# Patient Record
Sex: Female | Born: 2000 | Hispanic: Yes | Marital: Single | Smoking: Never smoker
Health system: Southern US, Community
[De-identification: ages and names within clinical notes are randomized; demographics above are authoritative.]

---
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
## 2021-01-12 ENCOUNTER — Encounter: Payer: Self-pay | Admitting: Emergency Medicine

## 2021-01-12 ENCOUNTER — Emergency Department
Admission: EM | Admit: 2021-01-12 | Discharge: 2021-01-12 | Disposition: A | Discharge status: Home / Self Care | Payer: Medicaid Other | Attending: Emergency Medicine | Admitting: Emergency Medicine

## 2021-01-12 ENCOUNTER — Other Ambulatory Visit: Payer: Self-pay

## 2021-01-12 DIAGNOSIS — H66012 Acute suppurative otitis media with spontaneous rupture of ear drum, left ear: Secondary | ICD-10-CM | POA: Insufficient documentation

## 2021-01-12 DIAGNOSIS — H9202 Otalgia, left ear: Secondary | ICD-10-CM | POA: Diagnosis present

## 2021-01-12 MED ORDER — AMOXICILLIN-POT CLAVULANATE 875-125 MG PO TABS: 1.0000 | ORAL_TABLET | Freq: Two times a day (BID) | ORAL | 0 refills | Status: AC

## 2021-01-12 MED ORDER — AMOXICILLIN-POT CLAVULANATE 875-125 MG PO TABS
1.0000 | ORAL_TABLET | Freq: Once | ORAL | Status: AC
  Administered 2021-01-12: 1 via ORAL
  Filled 2021-01-12: qty 1

## 2021-01-12 NOTE — Discharge Instructions (Addendum)
You were seen today for left ear pain.  Your left eardrum has ruptured.  I have put you on antibiotics twice daily for the next 10 days.  Please use cotton balls in your left ear to avoid water getting in the ear canal.  Please follow-up with ENT as an outpatient.

## 2021-01-12 NOTE — ED Notes (Signed)
D/C instructions given.  RX and follow up discussed.  All questions addressed.  Understanding verbalized.  Pt left ER ambulatory with mother.

## 2021-01-12 NOTE — ED Provider Notes (Signed)
[facility] Emergency Department Provider Note ____________________________________________  Time seen: 1745  I have reviewed the triage vital signs and the nursing notes.  HISTORY  Chief Complaint  Otalgia   HPI Alyssa Lucas is a 20 y.o. female presents to the ER with complaint of left ear pain and drainage.  She reports this started 3 days ago.  She was unable to describe what the ear pain felt like.  She reports the pain suddenly went away but then she noticed bloody drainage from her ear.  She denies headaches, visual changes, runny nose, nasal congestion, hearing loss, sore throat, cough or shortness of breath.  She denies fever, chills or body aches.  She denies any penetrating trauma to the left ear.  She has not taken anything OTC for this.  She reports history of recurrent ear infections from childhood.  History reviewed. No pertinent past medical history.  There are no problems to display for this patient.   History reviewed. No pertinent surgical history.  Prior to Admission medications   Medication Sig Start Date End Date Taking? Authorizing Provider  amoxicillin-clavulanate (AUGMENTIN) 875-125 MG tablet Take 1 tablet by mouth every 12 (twelve) hours for 10 days. 01/12/21 01/22/21 Yes BaitySalvadore Oxford, NP    Allergies Patient has no known allergies.  History reviewed. No pertinent family history.  Social History Social History   Tobacco Use  . Smoking status: Never Smoker  . Smokeless tobacco: Never Used  Vaping Use  . Vaping Use: Never used  Substance Use Topics  . Alcohol use: No  . Drug use: No    Review of Systems  Constitutional: Negative for fever. ENT: Positive for left ear pain and drainage.  Negative for runny nose, nasal congestion, loss of hearing or sore throat. Cardiovascular: Negative for chest pain or chest tightness. Respiratory: Negative for cough or shortness of breath. Skin: Negative for rash. Neurological:  Negative for headaches, dizziness, focal weakness, tingling or numbness. ____________________________________________  PHYSICAL EXAM:  VITAL SIGNS: ED Triage Vitals  Enc Vitals Group     BP 01/12/21 1726 117/71     Pulse Rate 01/12/21 1726 75     Resp 01/12/21 1726 16     Temp 01/12/21 1726 98.4 F (36.9 C)     Temp Source 01/12/21 1726 Oral     SpO2 01/12/21 1726 100 %     Weight --      Height --      Head Circumference --      Peak Flow --      Pain Score 01/12/21 1725 0     Pain Loc --      Pain Edu? --      Excl. in GC? --     Constitutional: Alert and oriented. Well appearing and in no distress. Head: Normocephalic and atraumatic. Eyes: Conjunctivae are normal. PERRL. Normal extraocular movements Ears: Ruptured TM on the left with dried blood noted in the external canal. Hematological/Lymphatic/Immunological: No cervical lymphadenopathy. Cardiovascular: Normal rate, regular rhythm.  Respiratory: Normal respiratory effort. No wheezes/rales/rhonchi. Neurologic:  Normal gait without ataxia. Normal speech and language. No gross focal neurologic deficits are appreciated. Skin:  Skin is warm, dry and intact. No rash noted.  INITIAL IMPRESSION / ASSESSMENT AND PLAN / ED COURSE  Ruptured TM, Right:  Augmentin 875-125 mg p.o. x1 in ER Rx for Augmentin 875-125 mg p.o. BID x 10 days Encourage use of cotton balls to avoid water getting in the ear We will have  her follow-up with ENT as an outpatient Return precautions discussed _______________________________________________  FINAL CLINICAL IMPRESSION(S) / ED DIAGNOSES  Final diagnoses:  Non-recurrent acute suppurative otitis media of left ear with spontaneous rupture of tympanic membrane      Lorre Munroe, NP 01/12/21 1850    Shaune Pollack, MD 01/13/21 [phone]

## 2021-01-12 NOTE — ED Triage Notes (Signed)
Pt to ED via POV with c/o L ear pain. Pt A&O x4, ambulatory to triage desk at this time.

## 2022-02-10 ENCOUNTER — Other Ambulatory Visit: Payer: Self-pay | Admitting: Family Medicine

## 2022-02-10 DIAGNOSIS — Z3401 Encounter for supervision of normal first pregnancy, first trimester: Secondary | ICD-10-CM

## 2022-02-17 ENCOUNTER — Ambulatory Visit
Admission: RE | Admit: 2022-02-17 | Discharge: 2022-02-17 | Disposition: A | Discharge status: Home / Self Care | Payer: Medicaid Other | Source: Ambulatory Visit | Attending: Family Medicine | Admitting: Family Medicine

## 2022-02-17 ENCOUNTER — Other Ambulatory Visit: Payer: Self-pay | Admitting: Family Medicine

## 2022-02-17 DIAGNOSIS — Z3687 Encounter for antenatal screening for uncertain dates: Secondary | ICD-10-CM | POA: Insufficient documentation

## 2022-02-17 DIAGNOSIS — Z3A01 Less than 8 weeks gestation of pregnancy: Secondary | ICD-10-CM | POA: Diagnosis not present

## 2022-02-17 DIAGNOSIS — Z3401 Encounter for supervision of normal first pregnancy, first trimester: Secondary | ICD-10-CM

## 2022-02-22 ENCOUNTER — Emergency Department: Payer: Medicaid Other

## 2022-02-22 ENCOUNTER — Other Ambulatory Visit: Payer: Self-pay

## 2022-02-22 ENCOUNTER — Emergency Department
Admission: EM | Admit: 2022-02-22 | Discharge: 2022-02-22 | Disposition: A | Discharge status: Home / Self Care | Payer: Medicaid Other | Attending: Emergency Medicine | Admitting: Emergency Medicine

## 2022-02-22 DIAGNOSIS — O418X10 Other specified disorders of amniotic fluid and membranes, first trimester, not applicable or unspecified: Secondary | ICD-10-CM

## 2022-02-22 DIAGNOSIS — Z6710 Type A blood, Rh positive: Secondary | ICD-10-CM | POA: Diagnosis not present

## 2022-02-22 DIAGNOSIS — Z3A01 Less than 8 weeks gestation of pregnancy: Secondary | ICD-10-CM | POA: Diagnosis not present

## 2022-02-22 DIAGNOSIS — O4690 Antepartum hemorrhage, unspecified, unspecified trimester: Secondary | ICD-10-CM

## 2022-02-22 DIAGNOSIS — O209 Hemorrhage in early pregnancy, unspecified: Secondary | ICD-10-CM | POA: Diagnosis present

## 2022-02-22 LAB — URINALYSIS, ROUTINE W REFLEX MICROSCOPIC
Bilirubin Urine: NEGATIVE
Glucose, UA: NEGATIVE mg/dL
Ketones, ur: NEGATIVE mg/dL
Leukocytes,Ua: NEGATIVE
Nitrite: NEGATIVE
Protein, ur: NEGATIVE mg/dL
Specific Gravity, Urine: 1.023 (ref 1.005–1.030)
pH: 5 (ref 5.0–8.0)

## 2022-02-22 LAB — CBC WITH DIFFERENTIAL/PLATELET
Abs Immature Granulocytes: 0.02 10*3/uL (ref 0.00–0.07)
Basophils Absolute: 0 10*3/uL (ref 0.0–0.1)
Basophils Relative: 1 %
Eosinophils Absolute: 0.1 10*3/uL (ref 0.0–0.5)
Eosinophils Relative: 1 %
HCT: 39.6 % (ref 36.0–46.0)
Hemoglobin: 12.7 g/dL (ref 12.0–15.0)
Immature Granulocytes: 0 %
Lymphocytes Relative: 27 %
Lymphs Abs: 2 10*3/uL (ref 0.7–4.0)
MCH: 30 pg (ref 26.0–34.0)
MCHC: 32.1 g/dL (ref 30.0–36.0)
MCV: 93.4 fL (ref 80.0–100.0)
Monocytes Absolute: 0.4 10*3/uL (ref 0.1–1.0)
Monocytes Relative: 6 %
Neutro Abs: 5 10*3/uL (ref 1.7–7.7)
Neutrophils Relative %: 65 %
Platelets: 310 10*3/uL (ref 150–400)
RBC: 4.24 MIL/uL (ref 3.87–5.11)
RDW: 13.8 % (ref 11.5–15.5)
WBC: 7.5 10*3/uL (ref 4.0–10.5)
nRBC: 0 % (ref 0.0–0.2)

## 2022-02-22 LAB — ABO/RH: ABO/RH(D): A POS

## 2022-02-22 LAB — HCG, QUANTITATIVE, PREGNANCY: hCG, Beta Chain, Quant, S: 56315 m[IU]/mL — ABNORMAL HIGH (ref <5)

## 2022-02-22 NOTE — ED Provider Notes (Signed)
? ?[facility] ?Provider Note ? ? ? Event Date/Time  ? First MD Initiated Contact with Patient 02/22/22 2008   ?  (approximate) ? ? ?History  ? ?Vaginal Bleeding ? ? ?HPI ? ?Alyssa Lucas is a 21 y.o. female with no significant past medical history who is approximately [redacted] weeks pregnant who presents with complaints of vaginal bleeding, she reports this has been going on for less than a day and she describes spotting primarily.  No abdominal pain.  This is her first pregnancy. ?  ? ? ?Physical Exam  ? ?Triage Vital Signs: ?ED Triage Vitals [02/22/22 1939]  ?Enc Vitals Group  ?   BP 132/79  ?   Pulse Rate 100  ?   Resp 16  ?   Temp 99.5 ?F (37.5 ?C)  ?   Temp Source Oral  ?   SpO2 98 %  ?   Weight 40.8 kg (90 lb)  ?   Height 1.499 m (4\' 11" )  ?   Head Circumference   ?   Peak Flow   ?   Pain Score 0  ?   Pain Loc   ?   Pain Edu?   ?   Excl. in [place]?   ? ? ?Most recent vital signs: ?Vitals:  ? 02/22/22 2130 02/22/22 2200  ?BP: 111/76 112/69  ?Pulse: 91 87  ?Resp:  17  ?Temp:    ?SpO2: 100% 100%  ? ? ? ?General: Awake, no distress.  ?CV:  Good peripheral perfusion.  ?Resp:  Normal effort.  ?Abd:  No distention.  Soft, nontender ?Other:   ? ? ?ED Results / Procedures / Treatments  ? ?Labs ?(all labs ordered are listed, but only abnormal results are displayed) ?Labs Reviewed  ?URINALYSIS, ROUTINE W REFLEX MICROSCOPIC - Abnormal; Notable for the following components:  ?    Result Value  ? Color, Urine YELLOW (*)   ? APPearance HAZY (*)   ? Hgb urine dipstick LARGE (*)   ? Bacteria, UA RARE (*)   ? All other components within normal limits  ?HCG, QUANTITATIVE, PREGNANCY - Abnormal; Notable for the following components:  ? hCG, Beta Neomia Dear 56,315 (*)   ? All other components within normal limits  ?CBC WITH DIFFERENTIAL/PLATELET  ?ABO/RH  ? ? ? ?EKG ? ? ? ? ?RADIOLOGY ?Ultrasound demonstrates gestational sac which appears smaller than prior ultrasound, I have recommended follow-up ultrasound in 7  to 10 days. + Subchorionic hemorrhage ? ? ? ?PROCEDURES: ? ?Critical Care performed:  ? ?Procedures ? ? ?MEDICATIONS ORDERED IN ED: ?Medications - No data to display ? ? ?IMPRESSION / MDM / ASSESSMENT AND PLAN / ED COURSE  ?I reviewed the triage vital signs and the nursing notes. ? ?Patient presents with vaginal bleeding at approximately [redacted] weeks gestation.  This is her first pregnancy. ? ?Differential includes subchorionic hemorrhage, miscarriage, ectopic pregnancy. ? ?Lab work reviewed, patient is Rh+, beta-hCG is 56,000, CBC is unremarkable ? ?Ultrasound performed which demonstrates slightly smaller gestational sac within ultrasound performed 5 days ago, unclear whether this is consistent with failed pregnancy.  Positive subchorionic hemorrhage ? ?Recommendation for repeat ultrasound in 7 to 10 days ? ? ? ? ? ?  ? ? ?FINAL CLINICAL IMPRESSION(S) / ED DIAGNOSES  ? ?Final diagnoses:  ?Subchorionic hematoma in first trimester, single or unspecified fetus  ?Vaginal bleeding in pregnancy  ? ? ? ?Rx / DC Orders  ? ?ED Discharge Orders   ? ?  None  ? ?  ? ? ? ?Note:  This document was prepared using Dragon voice recognition software and may include unintentional dictation errors. ?  ?Lavonia Drafts, MD ?02/22/22 2313 ? ?

## 2022-02-22 NOTE — Discharge Instructions (Addendum)
We recommend you have a repeat ultrasound in 7 to 10 days as we discussed ?

## 2022-02-22 NOTE — ED Triage Notes (Signed)
Pt states is [redacted] weeks pregnant and having vaginal bleeding that 30 min ago. Pt states bleeding is light and she has not used any pads. Pt denies dysuria.  ?

## 2022-02-23 ENCOUNTER — Other Ambulatory Visit: Payer: Self-pay | Admitting: Family Medicine

## 2022-02-25 ENCOUNTER — Other Ambulatory Visit: Payer: Self-pay | Admitting: Family Medicine

## 2022-02-25 DIAGNOSIS — Z3401 Encounter for supervision of normal first pregnancy, first trimester: Secondary | ICD-10-CM

## 2022-03-03 ENCOUNTER — Ambulatory Visit
Admission: RE | Admit: 2022-03-03 | Discharge: 2022-03-03 | Disposition: A | Discharge status: Home / Self Care | Payer: Medicaid Other | Source: Ambulatory Visit | Attending: Family Medicine | Admitting: Family Medicine

## 2022-03-03 ENCOUNTER — Other Ambulatory Visit: Payer: Self-pay | Admitting: Family Medicine

## 2022-03-03 DIAGNOSIS — Z3401 Encounter for supervision of normal first pregnancy, first trimester: Secondary | ICD-10-CM

## 2022-03-03 DIAGNOSIS — Z3A08 8 weeks gestation of pregnancy: Secondary | ICD-10-CM | POA: Insufficient documentation

## 2022-03-03 DIAGNOSIS — Z3689 Encounter for other specified antenatal screening: Secondary | ICD-10-CM | POA: Insufficient documentation

## 2022-03-05 ENCOUNTER — Encounter: Payer: Self-pay | Admitting: Emergency Medicine

## 2022-03-05 ENCOUNTER — Emergency Department
Admission: EM | Admit: 2022-03-05 | Discharge: 2022-03-06 | Disposition: A | Discharge status: Home / Self Care | Payer: Medicaid Other | Attending: Emergency Medicine | Admitting: Emergency Medicine

## 2022-03-05 ENCOUNTER — Other Ambulatory Visit: Payer: Self-pay

## 2022-03-05 DIAGNOSIS — O039 Complete or unspecified spontaneous abortion without complication: Secondary | ICD-10-CM | POA: Diagnosis not present

## 2022-03-05 DIAGNOSIS — Z3A00 Weeks of gestation of pregnancy not specified: Secondary | ICD-10-CM | POA: Insufficient documentation

## 2022-03-05 DIAGNOSIS — O209 Hemorrhage in early pregnancy, unspecified: Secondary | ICD-10-CM | POA: Diagnosis present

## 2022-03-05 LAB — CBC WITH DIFFERENTIAL/PLATELET
Abs Immature Granulocytes: 0.06 10*3/uL (ref 0.00–0.07)
Basophils Absolute: 0 10*3/uL (ref 0.0–0.1)
Basophils Relative: 0 %
Eosinophils Absolute: 0.1 10*3/uL (ref 0.0–0.5)
Eosinophils Relative: 1 %
HCT: 39 % (ref 36.0–46.0)
Hemoglobin: 13.1 g/dL (ref 12.0–15.0)
Immature Granulocytes: 1 %
Lymphocytes Relative: 13 %
Lymphs Abs: 1.4 10*3/uL (ref 0.7–4.0)
MCH: 30.3 pg (ref 26.0–34.0)
MCHC: 33.6 g/dL (ref 30.0–36.0)
MCV: 90.1 fL (ref 80.0–100.0)
Monocytes Absolute: 0.6 10*3/uL (ref 0.1–1.0)
Monocytes Relative: 5 %
Neutro Abs: 9.3 10*3/uL — ABNORMAL HIGH (ref 1.7–7.7)
Neutrophils Relative %: 80 %
Platelets: 325 10*3/uL (ref 150–400)
RBC: 4.33 MIL/uL (ref 3.87–5.11)
RDW: 13.4 % (ref 11.5–15.5)
WBC: 11.5 10*3/uL — ABNORMAL HIGH (ref 4.0–10.5)
nRBC: 0 % (ref 0.0–0.2)

## 2022-03-05 LAB — SAMPLE TO BLOOD BANK

## 2022-03-05 NOTE — ED Triage Notes (Signed)
Pt to ED from home c/o vaginal bleeding today.  States was approx 3 months pregnant.  Passed products of conception at home and thinks she did again in the lobby.  Bright red bleeding, denies clots.  First pregnancy.  Denies pain or SOB, skin WNL, A&Ox4, chest rise even and unlabored.

## 2022-03-06 LAB — HCG, QUANTITATIVE, PREGNANCY: hCG, Beta Chain, Quant, S: 12621 m[IU]/mL — ABNORMAL HIGH (ref <5)

## 2022-03-06 LAB — BASIC METABOLIC PANEL
Anion gap: 9 (ref 5–15)
BUN: 12 mg/dL (ref 6–20)
CO2: 23 mmol/L (ref 22–32)
Calcium: 8.8 mg/dL — ABNORMAL LOW (ref 8.9–10.3)
Chloride: 105 mmol/L (ref 98–111)
Creatinine, Ser: 0.51 mg/dL (ref 0.44–1.00)
GFR, Estimated: >60 mL/min (ref >60)
Glucose, Bld: 106 mg/dL — ABNORMAL HIGH (ref 70–99)
Potassium: 3.4 mmol/L — ABNORMAL LOW (ref 3.5–5.1)
Sodium: 137 mmol/L (ref 135–145)

## 2022-03-06 NOTE — ED Provider Notes (Signed)
[facility] Provider Note    Event Date/Time   First MD Initiated Contact with Patient 03/05/22 2314     (approximate)   History   Vaginal Bleeding   HPI  Alyssa GEST is a 21 y.o. female who presents for evaluation of vaginal bleeding.  Patient was seen here 11 days ago after positive pregnancy test.  She had an ultrasound that showed a questionable failed pregnancy.  She then had a repeat ultrasound done 2 days ago which confirmed fetal pregnancy.  Patient reports that she has been bleeding for the last week.  Today she passed what she thinks it was a fetal sac at home.  She is here with her mom to "make sure she has passed the pregnancy."  She denies severe bleeding, abdominal pain, dizziness, chest pain or shortness of breath.     History reviewed. No pertinent past medical history.  History reviewed. No pertinent surgical history.   Physical Exam   Triage Vital Signs: ED Triage Vitals  Enc Vitals Group     BP 03/05/22 2310 (!) 134/91     Pulse Rate 03/05/22 2310 (!) 144     Resp 03/05/22 2310 18     Temp 03/05/22 2310 98.7 F (37.1 C)     Temp Source 03/05/22 2310 Oral     SpO2 03/05/22 2310 100 %     Weight 03/05/22 2311 90 lb (40.8 kg)     Height 03/05/22 2311 4\' 11"  (1.499 m)     Head Circumference --      Peak Flow --      Pain Score 03/05/22 2311 0     Pain Loc --      Pain Edu? --      Excl. in GC? --     Most recent vital signs: Vitals:   03/05/22 2310 03/06/22 0019  BP: (!) 134/91 103/70  Pulse: (!) 144 80  Resp: 18 18  Temp: 98.7 F (37.1 C)   SpO2: 100% 100%     Constitutional: Alert and oriented. Well appearing and in no apparent distress. HEENT:      Head: Normocephalic and atraumatic.         Eyes: Conjunctivae are normal. Sclera is non-icteric.       Mouth/Throat: Mucous membranes are moist.       Neck: Supple with no signs of meningismus. Cardiovascular: Regular rate and rhythm. No murmurs, gallops, or  rubs. 2+ symmetrical distal pulses are present in all extremities.  Respiratory: Normal respiratory effort. Lungs are clear to auscultation bilaterally.  Gastrointestinal: Soft, non tender, and non distended with positive bowel sounds. No rebound or guarding. Genitourinary: No CVA tenderness. Musculoskeletal:  No edema, cyanosis, or erythema of extremities. Neurologic: Normal speech and language. Face is symmetric. Moving all extremities. No gross focal neurologic deficits are appreciated. Skin: Skin is warm, dry and intact. No rash noted. Psychiatric: Mood and affect are normal. Speech and behavior are normal.  ED Results / Procedures / Treatments   Labs (all labs ordered are listed, but only abnormal results are displayed) Labs Reviewed  CBC WITH DIFFERENTIAL/PLATELET - Abnormal; Notable for the following components:      Result Value   WBC 11.5 (*)    Neutro Abs 9.3 (*)    All other components within normal limits  BASIC METABOLIC PANEL - Abnormal; Notable for the following components:   Potassium 3.4 (*)    Glucose, Bld 106 (*)    Calcium 8.8 (*)  All other components within normal limits  HCG, QUANTITATIVE, PREGNANCY  SAMPLE TO BLOOD BANK     EKG  none   RADIOLOGY none   PROCEDURES:  Critical Care performed: No  Procedures    IMPRESSION / MDM / ASSESSMENT AND PLAN / ED COURSE  I reviewed the triage vital signs and the nursing notes.  21 y.o. female who presents for evaluation of vaginal bleeding in the setting of a diagnosed failed pregnancy on ultrasound 2 days ago.  Patient reports passing what he looks like a gestational sac at home and "wants to make sure she passed the pregnancy."  She is well-appearing in no distress, initially tachycardic with a pulse of 144 but that resolved without intervention.  Abdomen soft and nontender  Ddx: Ongoing miscarriage   Plan: hCG for trending, CBC to rule out acute blood loss anemia.  Patient is A+ no indication for  RhoGAM`   MEDICATIONS GIVEN IN ED: Medications - No data to display   ED COURSE: Labs showing no signs of acute blood loss anemia with a hemoglobin of 13.1.  Patient's pulse is 80 with a BP of 103/70.  No significant electrolyte derangements or AKI.  We discussed supportive care at home and explained to them that she will need a repeat ultrasound once that she is no longer bleeding to make sure that she did not retain any products of conception.  In the meantime just to treat this as a menstrual period.  Instructions were provided to patient and her mother who is at bedside.  Discussed my standard return precautions.   Consults: None   EMR reviewed including 2 prior ultrasounds for this pregnancy and last visit with OB for her pregnancy    FINAL CLINICAL IMPRESSION(S) / ED DIAGNOSES   Final diagnoses:  Miscarriage     Rx / DC Orders   ED Discharge Orders     None        Note:  This document was prepared using Dragon voice recognition software and may include unintentional dictation errors.   Please note:  Patient was evaluated in Emergency Department today for the symptoms described in the history of present illness. Patient was evaluated in the context of the global COVID-19 pandemic, which necessitated consideration that the patient might be at risk for infection with the SARS-CoV-2 virus that causes COVID-19. Institutional protocols and algorithms that pertain to the evaluation of patients at risk for COVID-19 are in a state of rapid change based on information released by regulatory bodies including the CDC and federal and state organizations. These policies and algorithms were followed during the patient's care in the ED.  Some ED evaluations and interventions may be delayed as a result of limited staffing during the pandemic.       Don Perking, [place], MD 03/06/22 [phone]

## 2022-03-25 ENCOUNTER — Other Ambulatory Visit: Payer: Self-pay | Admitting: Family Medicine

## 2022-03-25 DIAGNOSIS — O039 Complete or unspecified spontaneous abortion without complication: Secondary | ICD-10-CM

## 2022-04-01 ENCOUNTER — Ambulatory Visit: Admission: RE | Admit: 2022-04-01 | Payer: Medicaid Other | Source: Ambulatory Visit

## 2022-04-17 ENCOUNTER — Ambulatory Visit: Payer: Medicaid Other | Attending: Family Medicine

## 2022-06-12 ENCOUNTER — Other Ambulatory Visit: Payer: Self-pay | Admitting: Family Medicine

## 2022-06-12 DIAGNOSIS — Z3401 Encounter for supervision of normal first pregnancy, first trimester: Secondary | ICD-10-CM

## 2022-06-25 ENCOUNTER — Other Ambulatory Visit: Payer: Self-pay | Admitting: Family Medicine

## 2022-06-25 ENCOUNTER — Ambulatory Visit
Admission: RE | Admit: 2022-06-25 | Discharge: 2022-06-25 | Disposition: A | Discharge status: Home / Self Care | Payer: Medicaid Other | Source: Ambulatory Visit | Attending: Family Medicine | Admitting: Family Medicine

## 2022-06-25 DIAGNOSIS — Z3401 Encounter for supervision of normal first pregnancy, first trimester: Secondary | ICD-10-CM | POA: Insufficient documentation

## 2022-06-25 DIAGNOSIS — Z3689 Encounter for other specified antenatal screening: Secondary | ICD-10-CM | POA: Diagnosis present

## 2022-06-25 DIAGNOSIS — Z3A13 13 weeks gestation of pregnancy: Secondary | ICD-10-CM | POA: Insufficient documentation

## 2022-06-30 ENCOUNTER — Other Ambulatory Visit: Payer: Self-pay | Admitting: Family Medicine

## 2022-06-30 DIAGNOSIS — Z3402 Encounter for supervision of normal first pregnancy, second trimester: Secondary | ICD-10-CM

## 2022-06-30 LAB — OB RESULTS CONSOLE RUBELLA ANTIBODY, IGM: Rubella: IMMUNE

## 2022-06-30 LAB — OB RESULTS CONSOLE HEPATITIS B SURFACE ANTIGEN: Hepatitis B Surface Ag: NEGATIVE

## 2022-06-30 LAB — OB RESULTS CONSOLE VARICELLA ZOSTER ANTIBODY, IGG: Varicella: IMMUNE

## 2022-07-25 ENCOUNTER — Other Ambulatory Visit: Payer: Self-pay

## 2022-07-25 ENCOUNTER — Emergency Department
Admission: EM | Admit: 2022-07-25 | Discharge: 2022-07-25 | Discharge status: Against Medical Advice | Payer: Medicaid Other | Attending: Emergency Medicine | Admitting: Emergency Medicine

## 2022-07-25 ENCOUNTER — Encounter: Payer: Self-pay | Admitting: Emergency Medicine

## 2022-07-25 DIAGNOSIS — K625 Hemorrhage of anus and rectum: Secondary | ICD-10-CM | POA: Diagnosis not present

## 2022-07-25 DIAGNOSIS — O99612 Diseases of the digestive system complicating pregnancy, second trimester: Secondary | ICD-10-CM | POA: Diagnosis not present

## 2022-07-25 DIAGNOSIS — O26892 Other specified pregnancy related conditions, second trimester: Secondary | ICD-10-CM | POA: Diagnosis present

## 2022-07-25 DIAGNOSIS — Z3A20 20 weeks gestation of pregnancy: Secondary | ICD-10-CM | POA: Diagnosis not present

## 2022-07-25 DIAGNOSIS — Z5321 Procedure and treatment not carried out due to patient leaving prior to being seen by health care provider: Secondary | ICD-10-CM | POA: Diagnosis not present

## 2022-07-25 LAB — COMPREHENSIVE METABOLIC PANEL
ALT: 41 U/L (ref 0–44)
AST: 36 U/L (ref 15–41)
Albumin: 3.5 g/dL (ref 3.5–5.0)
Alkaline Phosphatase: 45 U/L (ref 38–126)
Anion gap: 4 — ABNORMAL LOW (ref 5–15)
BUN: 7 mg/dL (ref 6–20)
CO2: 26 mmol/L (ref 22–32)
Calcium: 8.7 mg/dL — ABNORMAL LOW (ref 8.9–10.3)
Chloride: 107 mmol/L (ref 98–111)
Creatinine, Ser: 0.39 mg/dL — ABNORMAL LOW (ref 0.44–1.00)
GFR, Estimated: >60 mL/min (ref >60)
Glucose, Bld: 99 mg/dL (ref 70–99)
Potassium: 3.4 mmol/L — ABNORMAL LOW (ref 3.5–5.1)
Sodium: 137 mmol/L (ref 135–145)
Total Bilirubin: 0.4 mg/dL (ref 0.3–1.2)
Total Protein: 7.1 g/dL (ref 6.5–8.1)

## 2022-07-25 LAB — CBC
HCT: 33.7 % — ABNORMAL LOW (ref 36.0–46.0)
Hemoglobin: 11.5 g/dL — ABNORMAL LOW (ref 12.0–15.0)
MCH: 30.2 pg (ref 26.0–34.0)
MCHC: 34.1 g/dL (ref 30.0–36.0)
MCV: 88.5 fL (ref 80.0–100.0)
Platelets: 303 10*3/uL (ref 150–400)
RBC: 3.81 MIL/uL — ABNORMAL LOW (ref 3.87–5.11)
RDW: 14.1 % (ref 11.5–15.5)
WBC: 6.6 10*3/uL (ref 4.0–10.5)
nRBC: 0 % (ref 0.0–0.2)

## 2022-07-25 LAB — TYPE AND SCREEN

## 2022-07-25 NOTE — ED Triage Notes (Signed)
Pt presents to ER from home with complaints of rectal bleeding. Pt reports she had a BM this morning and noticed bright red blood on her stool. And saw some blood in the toilet tissue. Pt reports she is approximately [redacted] weeks pregnant. Pt talks in complete sentences no respiratory distress noted.

## 2022-07-25 NOTE — ED Notes (Signed)
Pt states she cannot stay, she has to pick up her mother in 1 hour, pt advised to return to ED if worsening bleeding, pain, dizziness, lightheadedness and any other concerning sxs. Pt verbalized understanding.  Pt also given instructions to sign up for mychart account.

## 2022-07-27 ENCOUNTER — Ambulatory Visit
Admission: RE | Admit: 2022-07-27 | Discharge: 2022-07-27 | Disposition: A | Discharge status: Home / Self Care | Payer: Medicaid Other | Source: Ambulatory Visit | Attending: Family Medicine | Admitting: Family Medicine

## 2022-07-27 DIAGNOSIS — Z3402 Encounter for supervision of normal first pregnancy, second trimester: Secondary | ICD-10-CM | POA: Diagnosis present

## 2022-07-27 DIAGNOSIS — Z3A18 18 weeks gestation of pregnancy: Secondary | ICD-10-CM | POA: Diagnosis not present

## 2022-07-31 ENCOUNTER — Other Ambulatory Visit: Payer: Self-pay | Admitting: Family Medicine

## 2022-07-31 DIAGNOSIS — Z3402 Encounter for supervision of normal first pregnancy, second trimester: Secondary | ICD-10-CM

## 2022-08-06 ENCOUNTER — Other Ambulatory Visit: Payer: Self-pay | Admitting: Family Medicine

## 2022-08-06 DIAGNOSIS — Z362 Encounter for other antenatal screening follow-up: Secondary | ICD-10-CM

## 2022-08-06 DIAGNOSIS — Z3402 Encounter for supervision of normal first pregnancy, second trimester: Secondary | ICD-10-CM

## 2022-08-07 ENCOUNTER — Ambulatory Visit: Admission: RE | Admit: 2022-08-07 | Payer: Medicaid Other | Source: Ambulatory Visit

## 2022-09-02 ENCOUNTER — Ambulatory Visit
Admission: RE | Admit: 2022-09-02 | Discharge: 2022-09-02 | Disposition: A | Discharge status: Home / Self Care | Payer: Medicaid Other | Source: Ambulatory Visit | Attending: Family Medicine | Admitting: Family Medicine

## 2022-09-02 DIAGNOSIS — Z3402 Encounter for supervision of normal first pregnancy, second trimester: Secondary | ICD-10-CM | POA: Insufficient documentation

## 2022-09-02 DIAGNOSIS — Z362 Encounter for other antenatal screening follow-up: Secondary | ICD-10-CM | POA: Insufficient documentation

## 2022-09-02 DIAGNOSIS — Z3689 Encounter for other specified antenatal screening: Secondary | ICD-10-CM | POA: Insufficient documentation

## 2022-09-02 DIAGNOSIS — Z3A23 23 weeks gestation of pregnancy: Secondary | ICD-10-CM | POA: Diagnosis not present

## 2022-09-22 LAB — OB RESULTS CONSOLE HIV ANTIBODY (ROUTINE TESTING): HIV: NONREACTIVE

## 2022-09-22 LAB — OB RESULTS CONSOLE RPR: RPR: NONREACTIVE

## 2022-10-19 NOTE — L&D Delivery Note (Signed)
Delivery Note  First Stage: Labor onset: 0300 Augmentation : none Analgesia /Anesthesia intrapartum: none SROM at 1232  Second Stage: Complete dilation at 1310 Onset of pushing at 1325 FHR second stage: intermittent fetal monitoring, reassuring FHT  Delivery of a viable female infant 12/26/2022 at 1350 by Lucrezia Europe, CNM. She delivered standing next to the toilet. delivery of fetal head in OA position with restitution to LOT. No nuchal cord;  Anterior then posterior shoulders delivered easily with gentle downward traction. Baby placed on mom's chest, and attended to by peds.  Cord double clamped after cessation of pulsation, cut by patient's mother. Cord blood sample collected   Third Stage: Placenta delivered Delena Bali intact with 3 VC @ 1407 Placenta disposition: discarded Uterine tone firm / bleeding scant  1st degree laceration identified  Anesthesia for repair: n/a Repair : hemostatic and approximated, no need for repair Est. Blood Loss (mL): [id number]  Complications: none  Mom to postpartum.  Baby to Couplet care / Skin to Skin.  Newborn: Birth Weight: TBD, infant skin-to-skin  Apgar Scores: 8, 9 Feeding planned: breastfeeding

## 2022-11-20 ENCOUNTER — Observation Stay
Admission: EM | Admit: 2022-11-20 | Discharge: 2022-11-20 | Disposition: A | Discharge status: Home / Self Care | Payer: Medicaid Other | Attending: Obstetrics and Gynecology | Admitting: Obstetrics and Gynecology

## 2022-11-20 ENCOUNTER — Other Ambulatory Visit: Payer: Self-pay

## 2022-11-20 DIAGNOSIS — O2333 Infections of other parts of urinary tract in pregnancy, third trimester: Principal | ICD-10-CM | POA: Insufficient documentation

## 2022-11-20 DIAGNOSIS — O99891 Other specified diseases and conditions complicating pregnancy: Secondary | ICD-10-CM | POA: Diagnosis not present

## 2022-11-20 DIAGNOSIS — J1089 Influenza due to other identified influenza virus with other manifestations: Secondary | ICD-10-CM | POA: Diagnosis not present

## 2022-11-20 DIAGNOSIS — Z79899 Other long term (current) drug therapy: Secondary | ICD-10-CM | POA: Diagnosis not present

## 2022-11-20 DIAGNOSIS — O26893 Other specified pregnancy related conditions, third trimester: Secondary | ICD-10-CM | POA: Diagnosis present

## 2022-11-20 DIAGNOSIS — Z1152 Encounter for screening for COVID-19: Secondary | ICD-10-CM | POA: Insufficient documentation

## 2022-11-20 DIAGNOSIS — Z3A34 34 weeks gestation of pregnancy: Secondary | ICD-10-CM | POA: Diagnosis not present

## 2022-11-20 DIAGNOSIS — R519 Headache, unspecified: Secondary | ICD-10-CM | POA: Diagnosis not present

## 2022-11-20 LAB — URINALYSIS, ROUTINE W REFLEX MICROSCOPIC
Bilirubin Urine: NEGATIVE
Glucose, UA: NEGATIVE mg/dL
Hgb urine dipstick: NEGATIVE
Ketones, ur: 5 mg/dL — AB
Leukocytes,Ua: NEGATIVE
Nitrite: NEGATIVE
Protein, ur: NEGATIVE mg/dL
Specific Gravity, Urine: 1.009 (ref 1.005–1.030)
pH: 7 (ref 5.0–8.0)

## 2022-11-20 LAB — RESP PANEL BY RT-PCR (RSV, FLU A&B, COVID)  RVPGX2
Influenza A by PCR: NEGATIVE
Influenza B by PCR: POSITIVE — AB
Resp Syncytial Virus by PCR: NEGATIVE
SARS Coronavirus 2 by RT PCR: NEGATIVE

## 2022-11-20 IMAGING — US US OB COMP LESS 14 WK
1 series · 14 of 28 positions shown · non-contrast
Comparison: No prior.

CLINICAL DATA: Pregnancy dating.

EXAM:
OBSTETRIC <14 WK ULTRASOUND
TECHNIQUE: Transabdominal ultrasound was performed for evaluation of the
gestation as well as the maternal uterus and adnexal regions.
Patient refused transvaginal exam.

[Series 1: us ob comp less 14 wk · 0.12mm/px · 67 acquisitions, 14 frames shown]
[im 3/67]
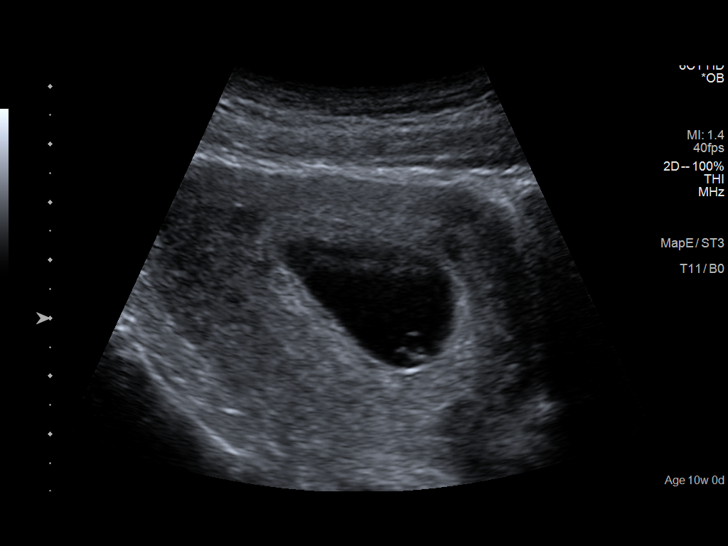
[im 8/67]
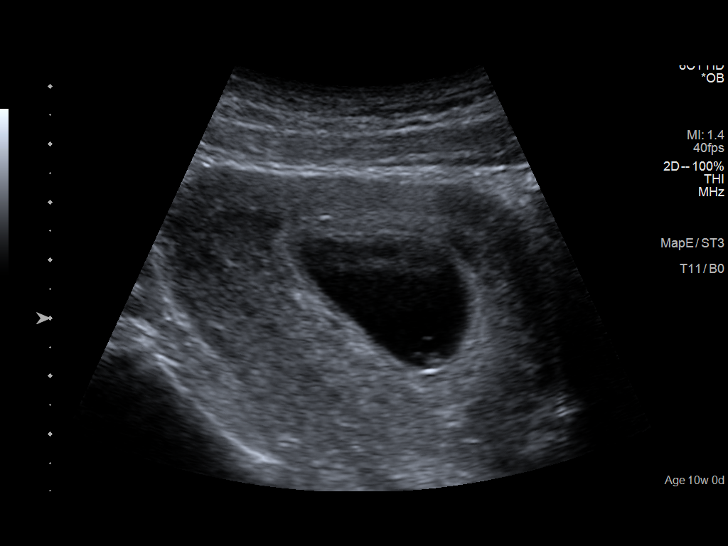
[im 13/67]
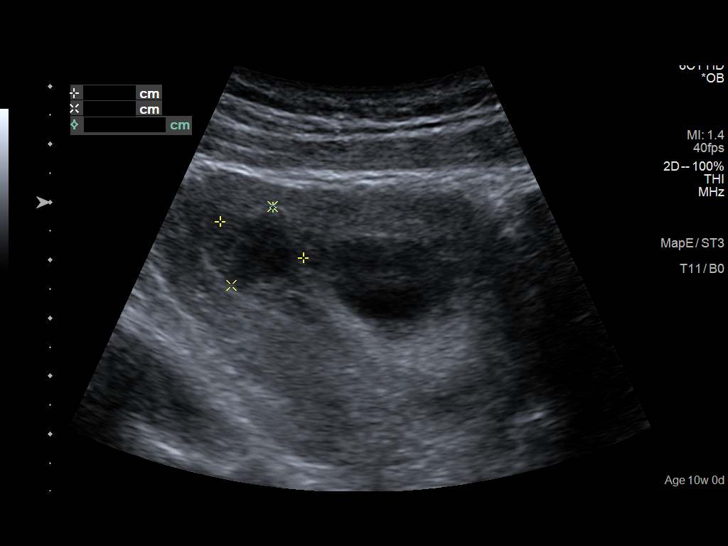
[im 18/67]
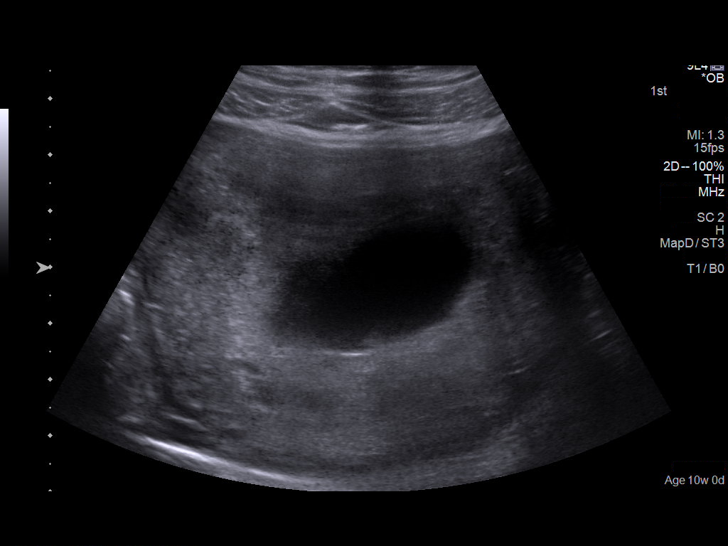
[im 23/67]
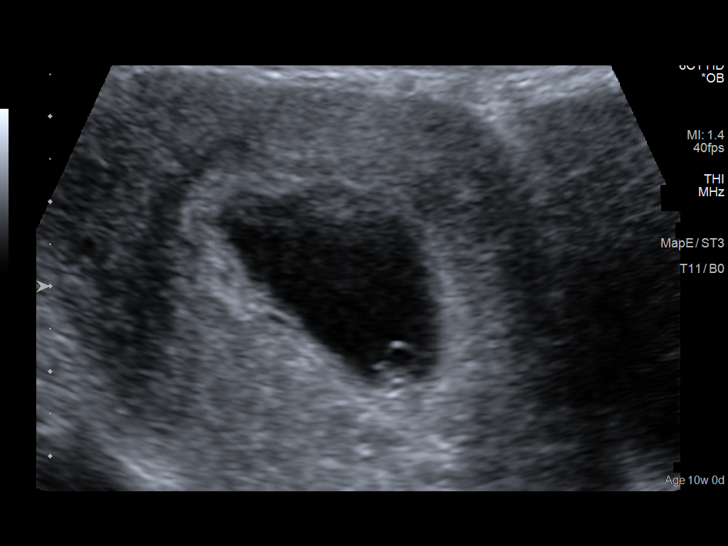
[im 27/67]
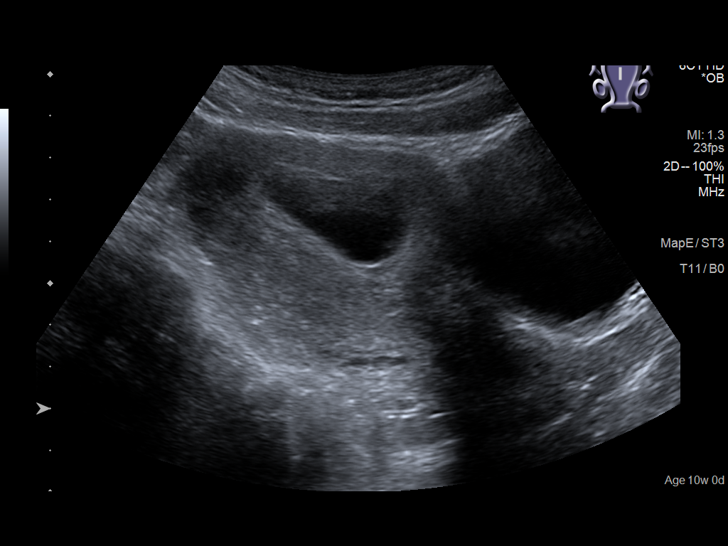
[im 32/67]
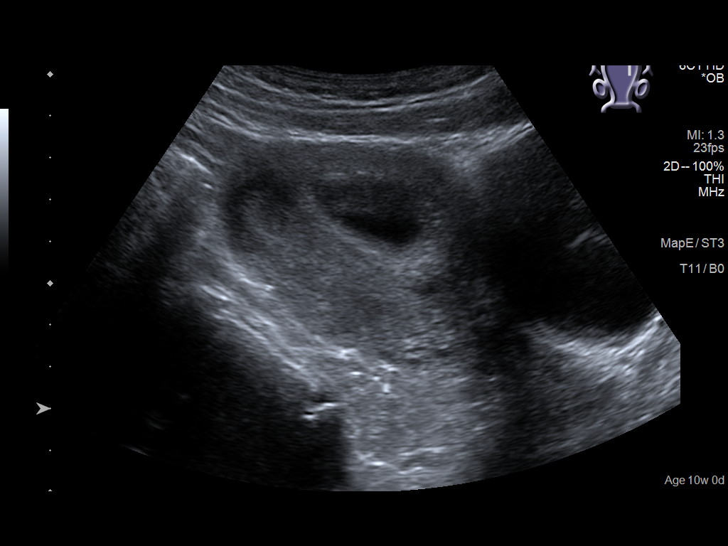
[im 37/67]
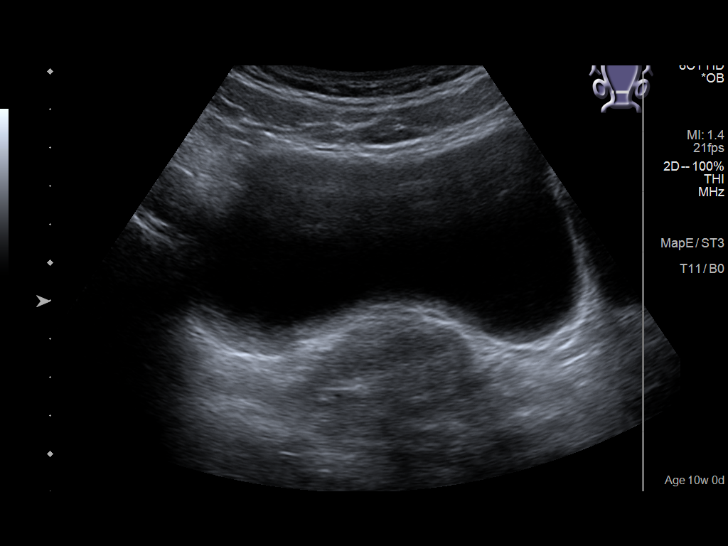
[im 42/67]
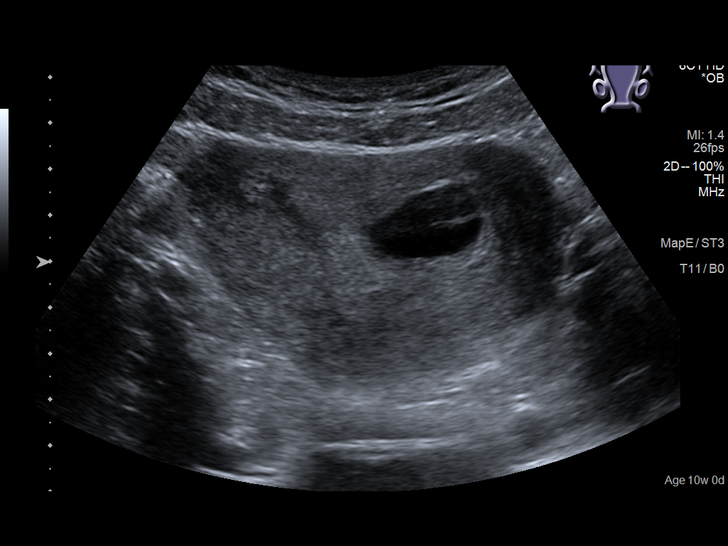
[im 47/67]
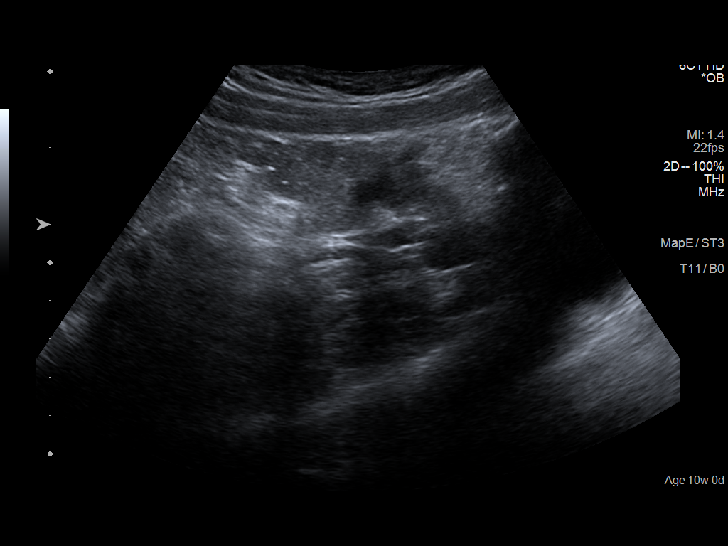
[im 52/67]
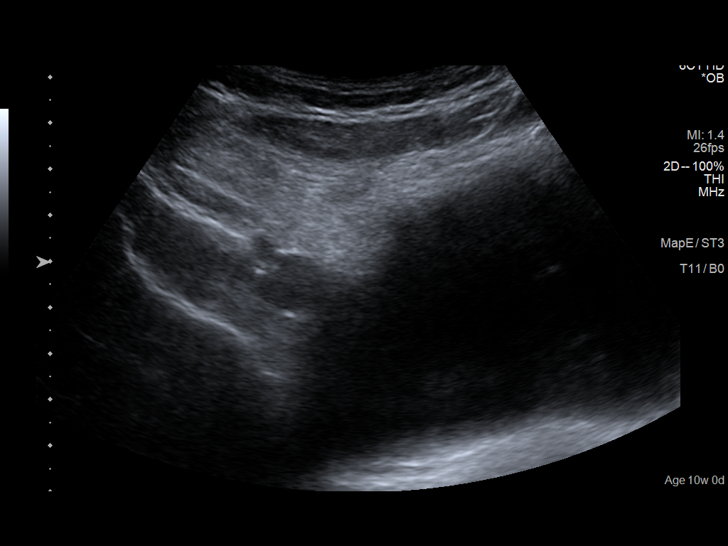
[im 57/67]
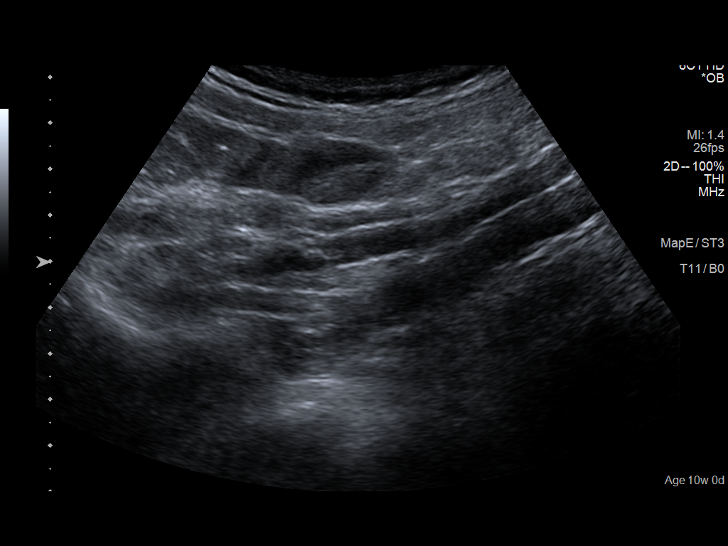
[im 62/67]
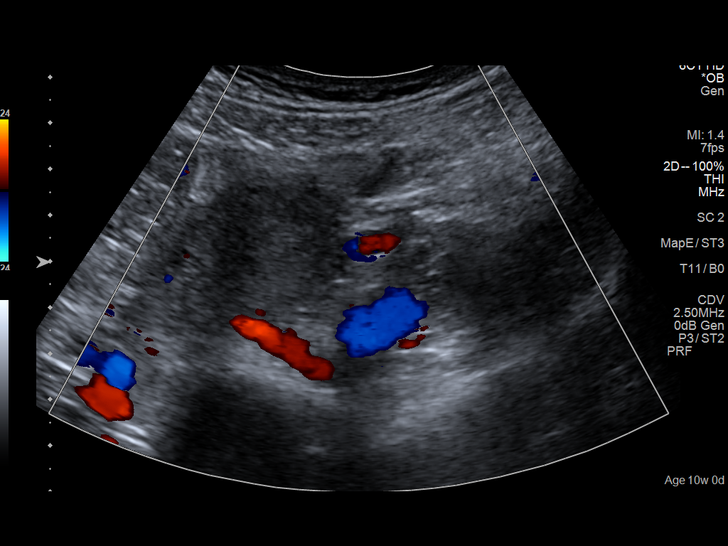
[im 67/67]
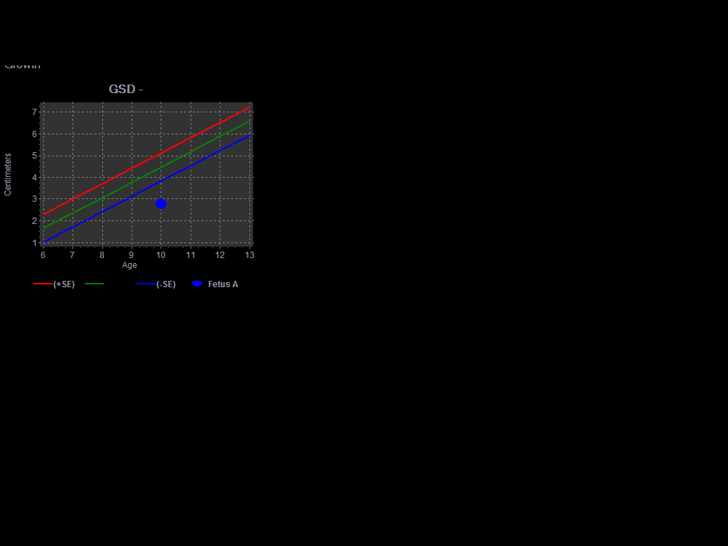

[14 of 28 positions shown; findings below may reference images not displayed]

FINDINGS: Intrauterine gestational sac: Single

Yolk sac:  Present

Embryo:  Not visualized

Cardiac Activity: None visualized

MSD:  27.7 mm   7 w   6 d

Subchorionic hemorrhage: Questionable subchorionic hemorrhage with
maximum diameter of 1.6 cm. Closed follow-up exams suggested.

Maternal uterus/adnexae: Ovaries not visualized due to overlying
bowel. No free fluid noted.
IMPRESSION: 1. Limited exam as patient refused transvaginal exam. Single
intrauterine gestational sac with visualization of yolk sac. Embryo
not visualized. Mean sac diameter of 27.7 mm corresponding to 7
weeks 6 days.

2. Questionable subchorionic hemorrhage with maximum diameter of
cm. Close follow-up exams suggested.

## 2022-11-20 MED ORDER — LACTATED RINGERS IV SOLN: INTRAVENOUS | Status: DC

## 2022-11-20 MED ORDER — OSELTAMIVIR PHOSPHATE 75 MG PO CAPS
75.0000 mg | ORAL_CAPSULE | Freq: Two times a day (BID) | ORAL | Status: DC
  Administered 2022-11-20: 75 mg via ORAL
  Filled 2022-11-20: qty 1

## 2022-11-20 MED ORDER — NIFEDIPINE 10 MG PO CAPS: 10.0000 mg | ORAL_CAPSULE | Freq: Four times a day (QID) | ORAL | Status: DC

## 2022-11-20 MED ORDER — NIFEDIPINE 10 MG PO CAPS: 20.0000 mg | ORAL_CAPSULE | Freq: Once | ORAL | Status: DC

## 2022-11-20 MED ORDER — LACTATED RINGERS IV BOLUS
1000.0000 mL | Freq: Once | INTRAVENOUS | Status: AC
  Administered 2022-11-20: 1000 mL via INTRAVENOUS

## 2022-11-20 MED ORDER — ACETAMINOPHEN 325 MG PO TABS
650.0000 mg | ORAL_TABLET | ORAL | Status: DC | PRN
  Administered 2022-11-20: 650 mg via ORAL
  Filled 2022-11-20: qty 2

## 2022-11-20 MED ORDER — ACETAMINOPHEN 325 MG PO TABS: 650.0000 mg | ORAL_TABLET | Freq: Four times a day (QID) | ORAL | 0 refills | Status: AC | PRN

## 2022-11-20 MED ORDER — OSELTAMIVIR PHOSPHATE 75 MG PO CAPS: 75.0000 mg | ORAL_CAPSULE | Freq: Two times a day (BID) | ORAL | 0 refills | Status: AC

## 2022-11-20 NOTE — OB Triage Note (Signed)
Patient discharged home in stable condition with information on flu management.

## 2022-11-20 NOTE — Discharge Instructions (Signed)

## 2022-11-20 NOTE — OB Triage Note (Signed)
Patient is a 22 yo, G1 P0 , at 34 weeks 4 days. Patient presents with complaints of cough and abdominal pain.  Patient denies any vaginal bleeding or LOF. Patient reports +FM. Monitors applied and assessing. VSS. Initial fetal heart tone 165. McVey CNM notified of patients arrival to unit. Plan to complete respiratory panel and assess for uterine activity.  Ovidio Hanger Kareli Hossain 11/20/2022 8:53 PM

## 2022-11-20 NOTE — OB Triage Provider Note (Signed)
Alyssa Lucas is a 22 y.o. female. She is at [redacted]w[redacted]d gestation. No LMP recorded (lmp unknown). Patient is pregnant. Estimated Date of Delivery: 12/28/22  [facility]   Current pregnancy complicated by:  Iron deficiency anemia- referred to hematology  Chief complaint: nasal congestion, HA, cough since yesterday. C/o "body feels hot"  - has no pregnancy complaints, feels normal FM, denies VB, LOF or UC.   S: Resting comfortably. no CTX, no VB.no LOF,  Active fetal movement. Denies: HA, visual changes, SOB, or RUQ/epigastric pain  Maternal Medical History:  No past medical history on file.  No past surgical history on file.  No Known Allergies  Prior to Admission medications   Not on File     Social History: She  reports that she has never smoked. She has never used smokeless tobacco. She reports that she does not drink alcohol and does not use drugs.  Family History: no history of gyn cancers  Review of Systems: A full review of systems was performed and negative except as noted in the HPI.     O:  BP 117/69   Pulse (!) 140   Temp 99.9 F (37.7 C) (Oral)   LMP  (LMP Unknown)  No results found for this or any previous visit (from the past 48 hour(s)).   Constitutional: NAD, AAOx3  HE/ENT: extraocular movements grossly intact, moist mucous membranes CV: RRR PULM: nl respiratory effort, CTABL     Abd: gravid, non-tender, non-distended, soft      Ext: Non-tender, Nonedematous   Psych: mood appropriate, speech normal Pelvic: deferred  Fetal  monitoring: Cat II Appropriate for GA Baseline: 160 Variability: moderate Accelerations: present x >2 Decelerations absent  TOCO: mild UI and 1 UC noted.     A/P: 22 y.o. [redacted]w[redacted]d here for antenatal surveillance for Headache, cough and nasal congestion  Principle Diagnosis:  headache, cough  Sent respiratory panel and pending UA Tylenol ordered Fetal Wellbeing: Reassuring but fetal tachycardia likely due  to maternal temp.  D/c home stable, precautions reviewed, follow-up as scheduled.    Francetta Found, CNM 11/20/2022  7:33 PM

## 2022-11-20 NOTE — Discharge Summary (Signed)
Alyssa Lucas is a 22 y.o. female. She is at [redacted]w[redacted]d gestation. No LMP recorded (lmp unknown). Patient is pregnant. Estimated Date of Delivery: 12/28/22  [facility] care site[facility]   Current pregnancy complicated by:  Unremarkable, no records Iron deficiency anemia  Chief complaint: HA, cough, feeling hot.    S: Resting comfortably. no CTX, no VB.no LOF,  Active fetal movement. Denies: HA, visual changes, SOB, or RUQ/epigastric pain  Maternal Medical History:  History reviewed. No pertinent past medical history.  History reviewed. No pertinent surgical history.  No Known Allergies  Prior to Admission medications   Medication Sig Start Date End Date Taking? Authorizing Provider  acetaminophen (TYLENOL) 325 MG tablet Take 2 tablets (650 mg total) by mouth every 6 (six) hours as needed for fever, mild pain or headache (for pain scale < 4  OR  temperature  >/=  100.5 F). 11/20/22   Kelbi Renstrom, Murray Hodgkins, CNM  oseltamivir (TAMIFLU) 75 MG capsule Take 1 capsule (75 mg total) by mouth 2 (two) times daily for 5 days. 11/21/22 11/26/22  Lashe Oliveira, Murray Hodgkins, CNM      Social History: She  reports that she has never smoked. She has never used smokeless tobacco. She reports that she does not drink alcohol and does not use drugs.  Family History:  no history of gyn cancers  Review of Systems: A full review of systems was performed and negative except as noted in the HPI.     O:  BP 128/65   Pulse (!) 109   Temp 99.4 F (37.4 C) (Oral)   LMP  (LMP Unknown)   SpO2 100%  Results for orders placed or performed during the hospital encounter of 11/20/22 (from the past 48 hour(s))  Resp panel by RT-PCR (RSV, Flu A&B, Covid) Anterior Nasal Swab   Collection Time: 11/20/22  7:22 PM   Specimen: Anterior Nasal Swab  Result Value Ref Range   SARS Coronavirus 2 by RT PCR NEGATIVE NEGATIVE   Influenza A by PCR NEGATIVE NEGATIVE   Influenza B by PCR POSITIVE (A) NEGATIVE   Resp Syncytial Virus by PCR  NEGATIVE NEGATIVE     Constitutional: NAD, AAOx3  HE/ENT: extraocular movements grossly intact, moist mucous membranes CV: RRR PULM: nl respiratory effort, CTABL     Abd: gravid, non-tender, non-distended, soft      Ext: Non-tender, Nonedematous   Psych: mood appropriate, speech normal Pelvic: deferred  Fetal  monitoring: Cat I Appropriate for GA Baseline: 150 Variability: moderate Accelerations:  present x >2 Decelerations absent  TOCO: resolved after IV bolus complete   A/P: 22 y.o. [redacted]w[redacted]d here for antenatal surveillance for HEadache and cough.   Principle Diagnosis:  influenza B   Preterm labor: not present; UCs stopped after IV bolus.  Fetal Wellbeing: Reassuring Cat 1 tracing; Reactive NST; fetal tachy resolved, baseline 150bpm Flu B postiive: Rx Tamiflu sent to pharmacy, given 1st dose here.  Reviewed preg safe meds to take at home for sx.  D/c home stable, precautions reviewed, follow-up as scheduled.    Francetta Found, CNM 11/20/2022  9:48 PM

## 2022-12-04 IMAGING — US US OB COMP LESS 14 WK
1 series · 15 of 28 positions shown · non-contrast
Comparison: Prior imaging from [DATE][REDACTED] and February 22, 2022.

CLINICAL DATA: A 21-year-old female presents for follow-up to
assess viability.

EXAM:
OBSTETRIC <14 WK ULTRASOUND
TECHNIQUE: Transabdominal ultrasound was performed for evaluation of the
gestation as well as the maternal uterus and adnexal regions.

[Series 1: us ob comp less 14 wks · 15 of 53 slices shown]
[im 1/53]
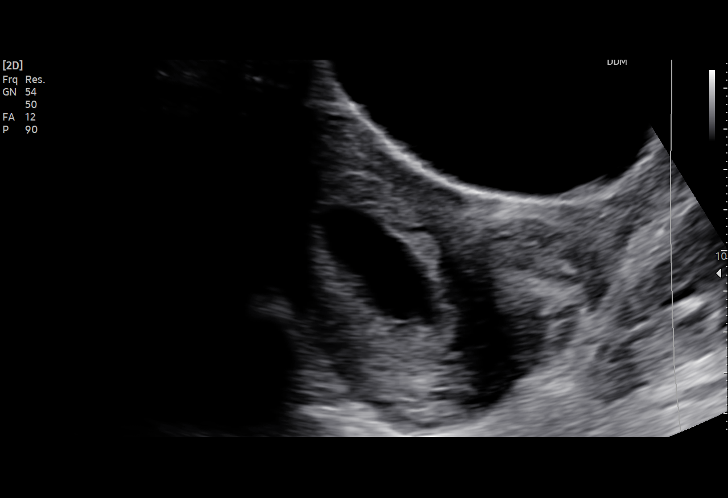
[im 4/53]
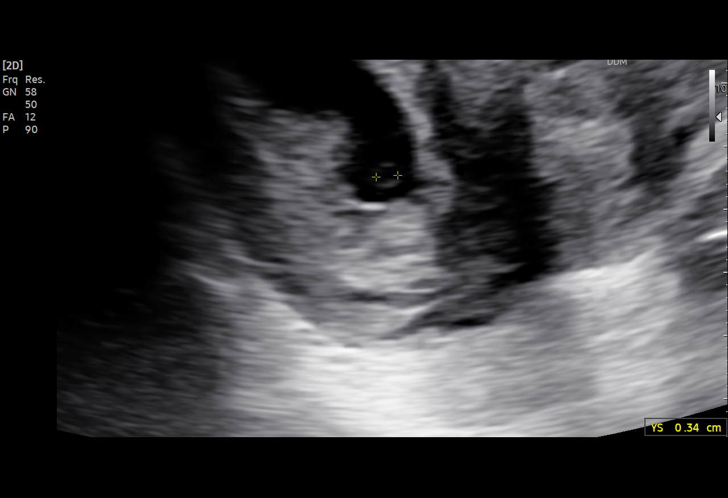
[im 8/53]
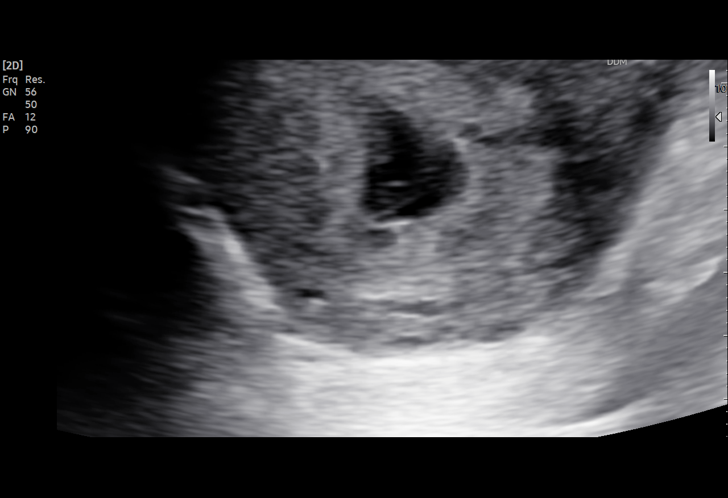
[im 12/53]
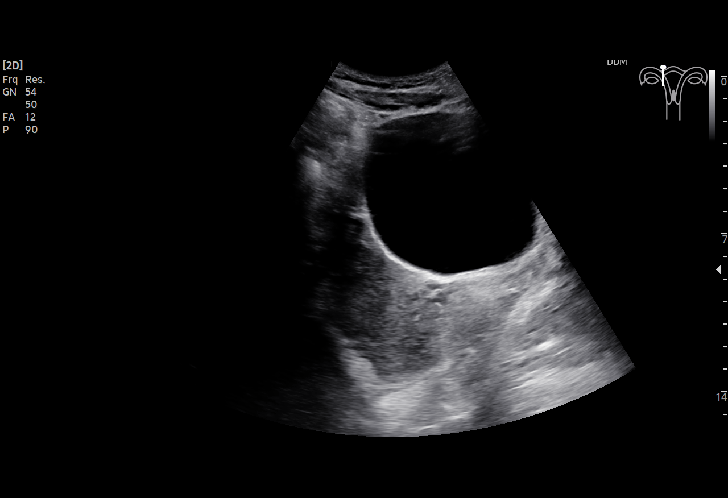
[im 16/53]
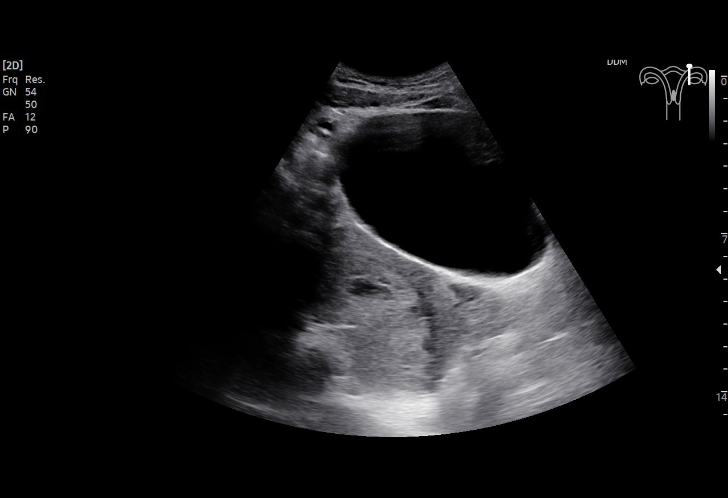
[im 20/53]
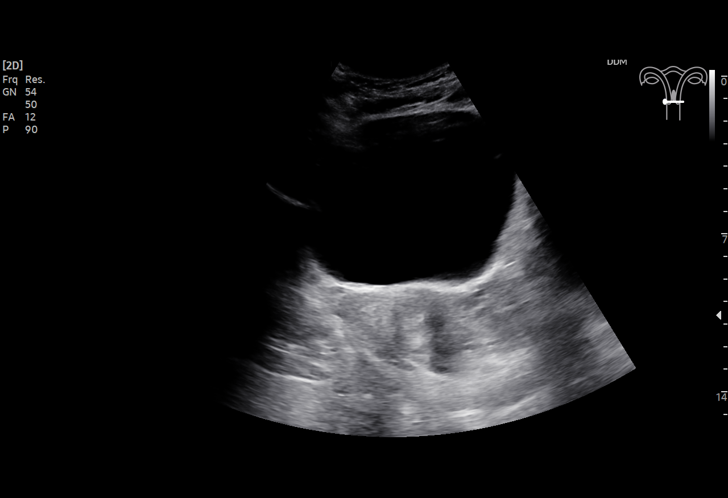
[im 24/53]
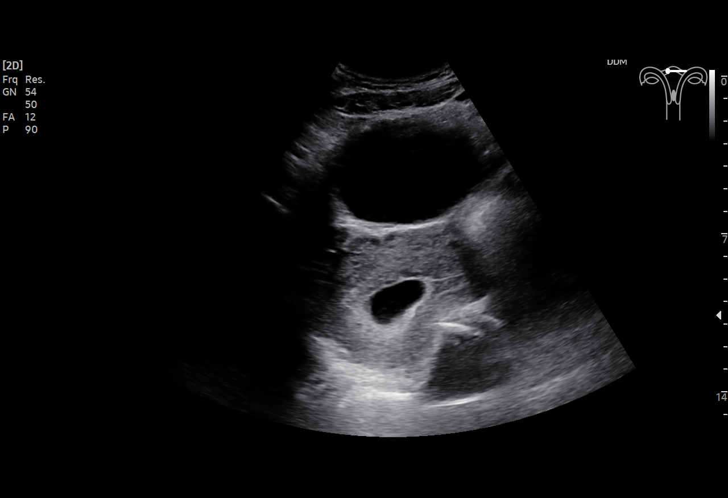
[im 27/53]
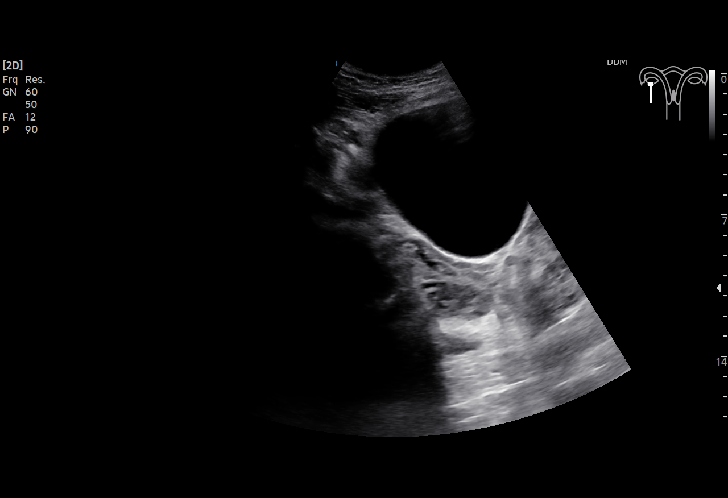
[im 29/53]
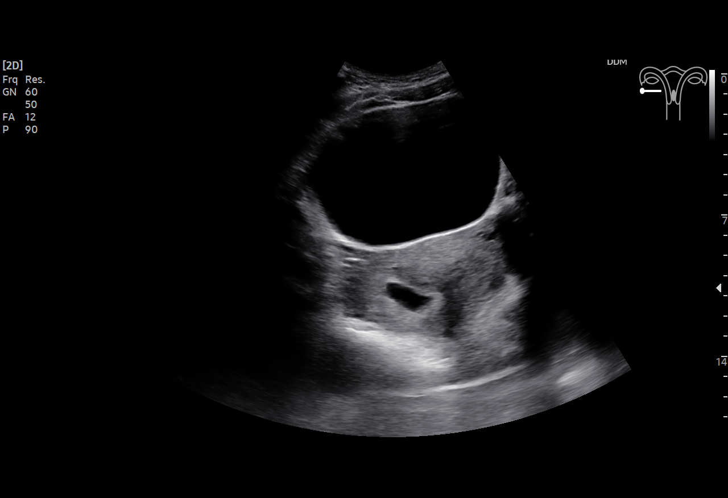
[im 33/53]
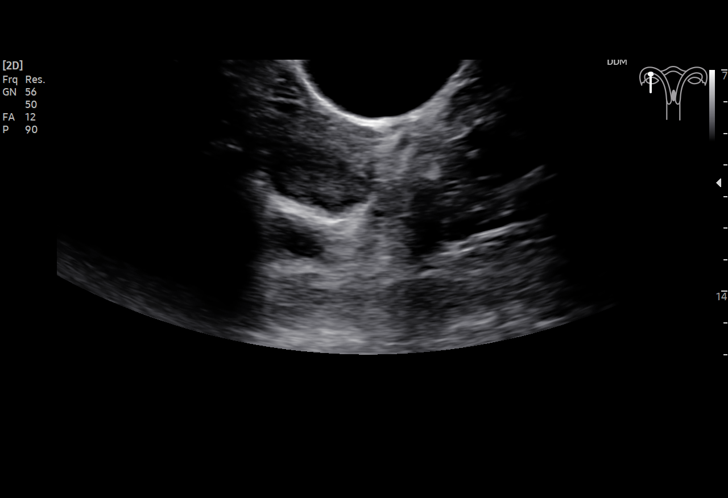
[im 37/53]
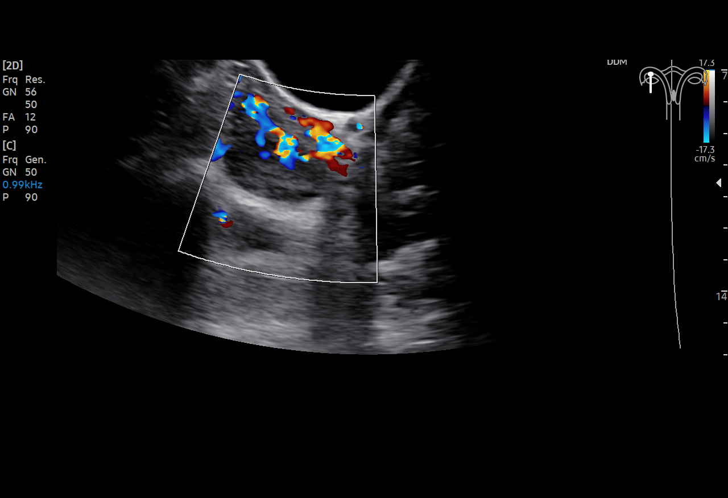
[im 41/53]
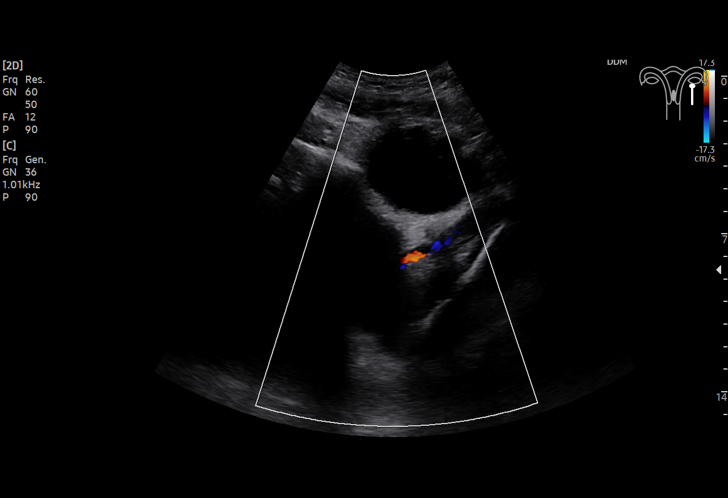
[im 45/53]
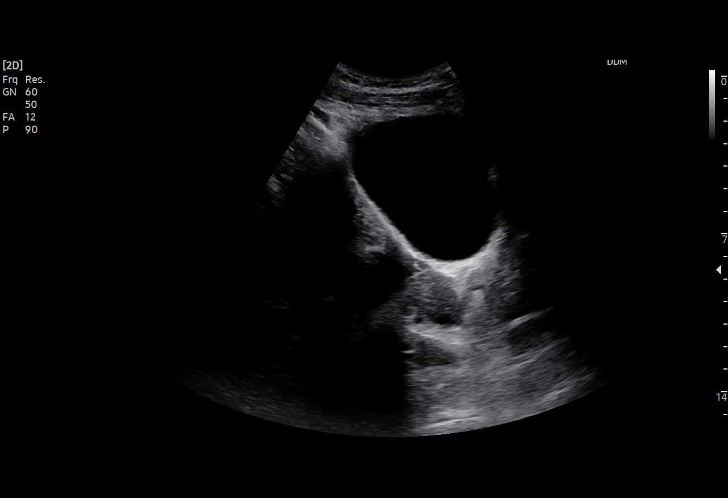
[im 49/53]
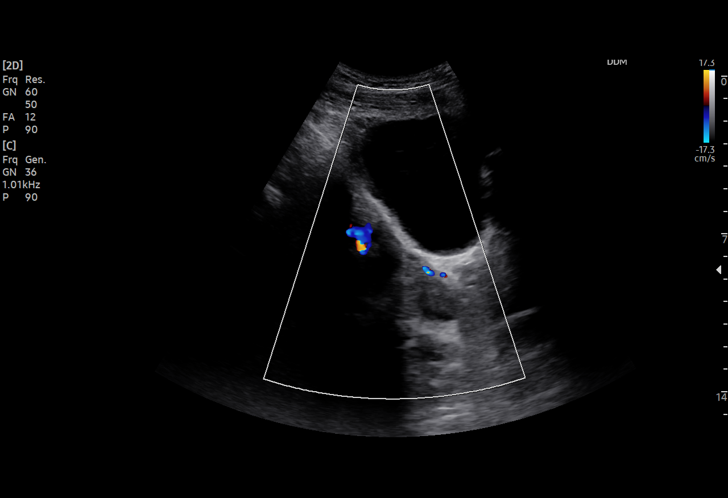
[im 53/53]
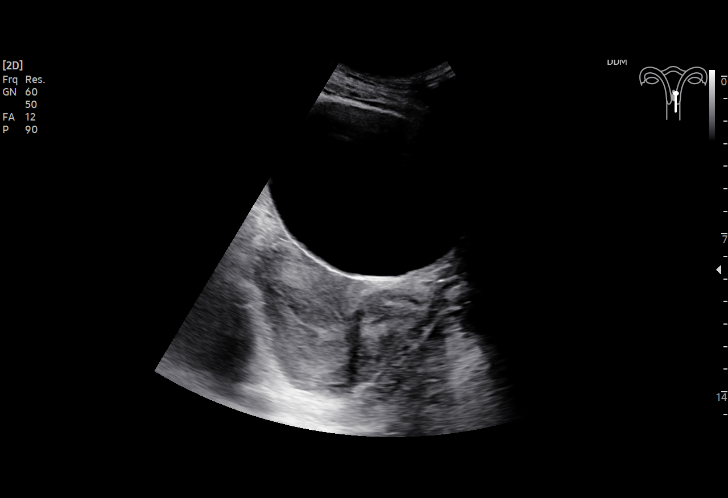

[15 of 28 positions shown; findings below may reference images not displayed]

FINDINGS: Intrauterine gestational sac: Single

Yolk sac:  Visualized

Embryo:  Not visualized

MSD:  29.7 mm   8 w   1 d

Subchorionic hemorrhage:  None visualized.

Maternal uterus/adnexae: No free fluid is visible in the pelvis.
Exam more limited than on previous imaging due to uterine position
and limited to transabdominal approach only as the patient did not
wish to have an endovaginal exam by report. Sac in the uterine
fundus. Lower uterine segment difficult to assess. The sac is
somewhat misshapen and oblong.

RIGHT ovary is normal.

LEFT ovary is normal.
IMPRESSION: Limited exam with mean sac diameter of 29.7, findings meet
definitive criteria for failed pregnancy but were performed via
transabdominal approach only due to patient wishes. This follows SRU
consensus guidelines: Diagnostic Criteria for Nonviable Pregnancy
Early in the First Trimester. N Engl J Med 8699;[DATE].

Exam is further somewhat limited due to uterine position.

## 2022-12-07 ENCOUNTER — Other Ambulatory Visit: Payer: Self-pay

## 2022-12-07 ENCOUNTER — Observation Stay
Admission: EM | Admit: 2022-12-07 | Discharge: 2022-12-07 | Disposition: A | Discharge status: Home / Self Care | Payer: Medicaid Other | Attending: Obstetrics | Admitting: Obstetrics

## 2022-12-07 ENCOUNTER — Encounter: Payer: Self-pay | Admitting: Obstetrics and Gynecology

## 2022-12-07 ENCOUNTER — Observation Stay: Payer: Medicaid Other

## 2022-12-07 DIAGNOSIS — O368130 Decreased fetal movements, third trimester, not applicable or unspecified: Principal | ICD-10-CM | POA: Insufficient documentation

## 2022-12-07 DIAGNOSIS — O2693 Pregnancy related conditions, unspecified, third trimester: Secondary | ICD-10-CM | POA: Insufficient documentation

## 2022-12-07 DIAGNOSIS — Z3A37 37 weeks gestation of pregnancy: Secondary | ICD-10-CM | POA: Diagnosis not present

## 2022-12-07 DIAGNOSIS — R519 Headache, unspecified: Secondary | ICD-10-CM | POA: Insufficient documentation

## 2022-12-07 DIAGNOSIS — O368190 Decreased fetal movements, unspecified trimester, not applicable or unspecified: Secondary | ICD-10-CM | POA: Diagnosis present

## 2022-12-07 LAB — OB RESULTS CONSOLE GBS: GBS: NEGATIVE

## 2022-12-07 LAB — OB RESULTS CONSOLE GC/CHLAMYDIA
Chlamydia: POSITIVE
Neisseria Gonorrhea: NEGATIVE

## 2022-12-07 MED ORDER — ZOLPIDEM TARTRATE 5 MG PO TABS: 5.0000 mg | ORAL_TABLET | Freq: Every evening | ORAL | Status: DC | PRN

## 2022-12-07 MED ORDER — ACETAMINOPHEN 325 MG PO TABS: 650.0000 mg | ORAL_TABLET | ORAL | Status: DC | PRN

## 2022-12-07 MED ORDER — CALCIUM CARBONATE ANTACID 500 MG PO CHEW: 2.0000 | CHEWABLE_TABLET | ORAL | Status: DC | PRN

## 2022-12-07 MED ORDER — PRENATAL MULTIVITAMIN CH: 1.0000 | ORAL_TABLET | Freq: Every day | ORAL | Status: DC

## 2022-12-07 MED ORDER — DOCUSATE SODIUM 100 MG PO CAPS: 100.0000 mg | ORAL_CAPSULE | Freq: Every day | ORAL | Status: DC

## 2022-12-07 NOTE — OB Triage Note (Signed)
Theola Sequin, CNM notified of results from Bedside US with AFI. Verbal order that patient can be discharged to home.

## 2022-12-07 NOTE — Discharge Summary (Signed)
Progress Note Decreased Fetal Movement.  Subjective:   Alyssa Lucas is a 22 y.o. female. She is at 74w0dgestation. She has noted decreased fetal movement for the last 1 days.  She also reports decreased FM since yesterday but denies bleeding, contractions, cramping or leaking. Her pregnancy has been complicated by:  Patient Active Problem List   Diagnosis Date Noted   Decreased fetal movement 12/07/2022   Headache in pregnancy, antepartum, third trimester 11/20/2022   PMH, PSH, POBH and problem list reviewed Medications and allergies reviewed.    Objective:   Today's Vitals   12/07/22 2105  BP: (!) 116/59  Pulse: 62  Temp: 98.5 F (36.9 C)  TempSrc: Oral   There is no height or weight on file to calculate BMI.  General appearance: alert, well appearing, and in no distress. External fetal monitoring: NST: Baseline FHR: 145 beats/min Variability: moderate Accelerations: present Decelerations: absent Tocometry: irregular  Interpretation:  INDICATIONS: decreased fetal movement RESULTS:  A NST procedure was performed with FHR monitoring and a normal baseline established, appropriate time of 20-40 minutes of evaluation, and accels >2 seen w 15x15 characteristics.  Results show a REACTIVE NST.    FAvelino LeedsCNM  No results found for this or any previous visit (from the past 48 hour(s)).   Assessment:   Pregnancy at 341w0dith concerns for decreased fetal movement. Evaluation reveals:   Reactive NST AFI 17.0 cm  Plan:   NST OB limited US[place]AM: OBSTETRICAL ULTRASOUND >14 WKS AND BIOPHYSICAL PROFILE   FINDINGS: Number of Fetuses: 1   Heart Rate:  140 bpm   Movement: Yes   Presentation: Cephalic   Previa: No   Placental Location: Anterior   Amniotic Fluid (Subjective): Normal   Amniotic Fluid (Objective):   Vertical pocket 5.5cm   AFI 17.0 cm   FETAL BIOMETRY   BPD:  8.8cm 35w 3d   Maternal Findings:   Cervix:  Not visualized    IMPRESSION: Single live intrauterine gestation in cephalic presentation with approximate gestational age of [redacted] weeksnd 3 days    Orders placed: Orders Placed This Encounter  Procedures   US[place]B Limited    Standing Status:   Standing    Number of Occurrences:   1    Order Specific Question:   What location should the exam be performed?    Answer:   [facility]    Order Specific Question:   Symptom/Reason for Exam    Answer:   Decreased fetal movement [[id number] Notify physician (specify)    Standing Status:   Standing    Number of Occurrences:   20    Order Specific Question:   Notify Physician    Answer:   for pulse less than 60 or greater than 120    Order Specific Question:   Notify Physician    Answer:   for respiratory rate less than 12 or greater than 28    Order Specific Question:   Notify Physician    Answer:   for temperature greater than 100.4    Order Specific Question:   Notify Physician    Answer:   for urinary output less than 30 ml/hr    Order Specific Question:   Notify Physician    Answer:   for systolic BP less than 80 or greater than 140    Order Specific Question:   Notify Physician    Answer:   for diastolic BP less than 40 or greater than  90   Vital signs    While awake, respect sleep.    Standing Status:   Standing    Number of Occurrences:   1   Activity as tolerated    Standing Status:   Standing    Number of Occurrences:   1   Defer vaginal exam for vaginal bleeding or PROM <37 weeks    Standing Status:   Standing    Number of Occurrences:   1   Apply Antepartum Care Plan    Standing Status:   Standing    Number of Occurrences:   1   Initiate Oral Care Protocol    Standing Status:   Standing    Number of Occurrences:   1   Initiate Carrier Fluid Protocol    Standing Status:   Standing    Number of Occurrences:   1   Full code    Standing Status:   Standing    Number of Occurrences:   1    Order Specific Question:   By:    Answer:    Consent: discussion documented in EHR   Fetal nonstress test    Standing Status:   Standing    Number of Occurrences:   1   Place in observation (patient's expected length of stay will be less than 2 midnights)    Standing Status:   Standing    Number of Occurrences:   1    Order Specific Question:   Hospital Area    Answer:   [place] [facility] [id number]    Order Specific Question:   Level of Care    Answer:   Labor and Delivery [101]    Order Specific Question:   Covid Evaluation    Answer:   Asymptomatic - no recent exposure (last 10 days) testing not required    Order Specific Question:   Diagnosis    Answer:   Decreased fetal movement [id number]    Order Specific Question:   Admitting Physician    Answer:   Boykin Nearing [id number]    Order Specific Question:   Attending Physician    Answer:   Ed Blalock [id number]    Patient expresses understanding of information provided and plan of care.    [place], CNM 12/07/2022  10:54 PM

## 2022-12-07 NOTE — OB Triage Note (Signed)
Pt arrived to unit wheeled by ED staff with complaints of decreased fetal movement since yesterday. Patient was seen at [facility] drew clinic this morning at 10 am and was told to come to hospital for further evaluation. Patient reports no active vaginal bleeding or symptoms consistent with LOF. Pt is G1P0 [redacted]w[redacted]d Patient placed on EFM and Toco to non tender area of abdomen. Patient medical history reviewed. Consents for treatment were signed. Patient encouraged to come promptly to the hospital when encouraged by provider to come or any signs of fetal distress including decreased movement or kick counts.  FTheola Sequin CNM at nurses station and is aware of patient's arrival.

## 2022-12-07 NOTE — OB Triage Note (Signed)
Alyssa Lucas, CNM on unit and reviewed FHR tracing strip and NST was unremarkable. CFM discontinued. Patient awaiting Limited OB US.

## 2022-12-25 ENCOUNTER — Observation Stay
Admission: EM | Admit: 2022-12-25 | Discharge: 2022-12-25 | Disposition: A | Discharge status: Home / Self Care | Payer: Medicaid Other | Source: Home / Self Care | Admitting: Obstetrics and Gynecology

## 2022-12-25 DIAGNOSIS — O479 False labor, unspecified: Secondary | ICD-10-CM | POA: Diagnosis present

## 2022-12-25 MED ORDER — HYDROXYZINE HCL 25 MG PO TABS
50.0000 mg | ORAL_TABLET | Freq: Once | ORAL | Status: AC
  Administered 2022-12-25: 50 mg via ORAL
  Filled 2022-12-25: qty 2

## 2022-12-25 MED ORDER — PRENATAL MULTIVITAMIN CH: 1.0000 | ORAL_TABLET | Freq: Every day | ORAL | Status: DC

## 2022-12-25 MED ORDER — CALCIUM CARBONATE ANTACID 500 MG PO CHEW: 2.0000 | CHEWABLE_TABLET | ORAL | Status: DC | PRN

## 2022-12-25 MED ORDER — ACETAMINOPHEN 325 MG PO TABS: 650.0000 mg | ORAL_TABLET | ORAL | Status: DC | PRN

## 2022-12-25 MED ORDER — LACTATED RINGERS IV SOLN: 125.0000 mL/h | INTRAVENOUS | Status: DC

## 2022-12-25 MED ORDER — ZOLPIDEM TARTRATE 5 MG PO TABS: 5.0000 mg | ORAL_TABLET | Freq: Every evening | ORAL | Status: DC | PRN

## 2022-12-25 MED ORDER — DOCUSATE SODIUM 100 MG PO CAPS: 100.0000 mg | ORAL_CAPSULE | Freq: Every day | ORAL | Status: DC

## 2022-12-25 NOTE — OB Triage Note (Signed)
Discharged home. Labor precautions reviewed. Left floor ambulatory with her mother. Dineen Kid

## 2022-12-25 NOTE — Discharge Summary (Signed)
Alyssa Lucas is a 22 y.o. female. She is at 71w4dgestation. No LMP recorded (lmp unknown). Patient is pregnant. 12/28/2022, Date entered prior to episode creation   [facility] Chief complaint: contractions   HPI: NSamonapresents to L&D with complaints of contractions that have gotten progressively worse.  They started earlier this morning and feel closer together.  She denies LOF or vaginal bleeding. Endorses good fetal movement.   S: Resting comfortably. , no VB.no LOF,  Active fetal movement.   Maternal Medical History:  Past Medical Hx:  has no past medical history on file.    Past Surgical Hx:  has no past surgical history on file.   No Known Allergies   Prior to Admission medications   Medication Sig Start Date End Date Taking? Authorizing Provider  acetaminophen (TYLENOL) 325 MG tablet Take 2 tablets (650 mg total) by mouth every 6 (six) hours as needed for fever, mild pain or headache (for pain scale < 4  OR  temperature  >/=  100.5 F). 11/20/22   McVey, RMurray Hodgkins CNM    Social History: She  reports that she has never smoked. She has never used smokeless tobacco. She reports that she does not drink alcohol and does not use drugs.  Family History: family history non-contributory   Review of Systems: A full review of systems was performed and negative except as noted in the HPI.     Pertinent Results:   O:  BP 128/81 (BP Location: Left Arm)   Pulse 61   Temp 99 F (37.2 C) (Oral)   Resp 16   LMP  (LMP Unknown)  No results found for this or any previous visit (from the past 48 hour(s)).   Constitutional: NAD, AAOx3  HE/ENT: extraocular movements grossly intact, moist mucous membranes CV: RRR PULM: nl respiratory effort Abd: gravid, non-tender, non-distended, soft  Ext: Non-tender, Nonedmeatous Psych: mood appropriate, speech normal SVE: Dilation: 2 Effacement (%): 80 Cervical Position: Middle, Anterior Station: -1 Presentation:  Vertex Exam by:: LSE  -SVE unchanged after 2 hours   NST: Baseline FHR: 140 beats/min Variability: moderate Accelerations: present Decelerations: absent Tocometry: 3-5 minutes, mild to moderate to palpation   Interpretation:  INDICATIONS: rule out uterine contractions RESULTS:  A NST procedure was performed with FHR monitoring and a normal baseline established, appropriate time of 20-40 minutes of evaluation, and accels >2 seen w 15x15 characteristics.  Results show a REACTIVE NST.   Assessment: 22y.o. G1P0 338w4d/08/2023, Date entered prior to episode creation   Principle diagnosis: Uterine contractions [O47.9]   Plan: 1) Reactive NST  -Category 1 tracing  -Reassuring fetal status   2) Labor: Not present  -Discussed warning signs to return to L&D triage with  -Reviewed comfort measures   3) Disposition: discharge home stable -Precautions reviewed  -Follow up as scheduled for prenatal appointment    ----- AnDrinda ButtsCNM Certified Nurse Midwife [facility]

## 2022-12-26 ENCOUNTER — Encounter: Payer: Self-pay | Admitting: Obstetrics and Gynecology

## 2022-12-26 ENCOUNTER — Other Ambulatory Visit: Payer: Self-pay

## 2022-12-26 ENCOUNTER — Inpatient Hospital Stay
Admission: EM | Admit: 2022-12-26 | Discharge: 2022-12-27 | DRG: 807 | Disposition: A | Discharge status: Home / Self Care | Payer: Medicaid Other

## 2022-12-26 DIAGNOSIS — O9902 Anemia complicating childbirth: Secondary | ICD-10-CM | POA: Diagnosis present

## 2022-12-26 DIAGNOSIS — Z3A39 39 weeks gestation of pregnancy: Secondary | ICD-10-CM | POA: Diagnosis not present

## 2022-12-26 DIAGNOSIS — D509 Iron deficiency anemia, unspecified: Secondary | ICD-10-CM | POA: Diagnosis present

## 2022-12-26 DIAGNOSIS — O26893 Other specified pregnancy related conditions, third trimester: Secondary | ICD-10-CM | POA: Diagnosis present

## 2022-12-26 LAB — CBC
HCT: 35.8 % — ABNORMAL LOW (ref 36.0–46.0)
Hemoglobin: 11.7 g/dL — ABNORMAL LOW (ref 12.0–15.0)
MCH: 27.9 pg (ref 26.0–34.0)
MCHC: 32.7 g/dL (ref 30.0–36.0)
MCV: 85.2 fL (ref 80.0–100.0)
Platelets: 237 10*3/uL (ref 150–400)
RBC: 4.2 MIL/uL (ref 3.87–5.11)
RDW: 21.2 % — ABNORMAL HIGH (ref 11.5–15.5)
WBC: 8.4 10*3/uL (ref 4.0–10.5)
nRBC: 0 % (ref 0.0–0.2)

## 2022-12-26 LAB — TYPE AND SCREEN
ABO/RH(D): A POS
Antibody Screen: NEGATIVE

## 2022-12-26 LAB — RPR: RPR Ser Ql: NONREACTIVE

## 2022-12-26 MED ORDER — TETANUS-DIPHTH-ACELL PERTUSSIS 5-2.5-18.5 LF-MCG/0.5 IM SUSY: 0.5000 mL | PREFILLED_SYRINGE | Freq: Once | INTRAMUSCULAR | Status: DC

## 2022-12-26 MED ORDER — WITCH HAZEL-GLYCERIN EX PADS
1.0000 | MEDICATED_PAD | CUTANEOUS | Status: DC | PRN
  Administered 2022-12-26: 1 via TOPICAL
  Filled 2022-12-26 (×2): qty 100

## 2022-12-26 MED ORDER — LIDOCAINE HCL (PF) 1 % IJ SOLN
INTRAMUSCULAR | Status: AC
  Filled 2022-12-26: qty 30

## 2022-12-26 MED ORDER — SOD CITRATE-CITRIC ACID 500-334 MG/5ML PO SOLN: 30.0000 mL | ORAL | Status: DC | PRN

## 2022-12-26 MED ORDER — LACTATED RINGERS IV SOLN: INTRAVENOUS | Status: DC

## 2022-12-26 MED ORDER — DIBUCAINE (PERIANAL) 1 % EX OINT
1.0000 | TOPICAL_OINTMENT | CUTANEOUS | Status: DC | PRN
  Filled 2022-12-26: qty 28

## 2022-12-26 MED ORDER — ONDANSETRON HCL 4 MG/2ML IJ SOLN: 4.0000 mg | INTRAMUSCULAR | Status: DC | PRN

## 2022-12-26 MED ORDER — LIDOCAINE HCL (PF) 1 % IJ SOLN: 30.0000 mL | INTRAMUSCULAR | Status: DC | PRN

## 2022-12-26 MED ORDER — AMMONIA AROMATIC IN INHA
RESPIRATORY_TRACT | Status: AC
  Filled 2022-12-26: qty 10

## 2022-12-26 MED ORDER — ONDANSETRON HCL 4 MG/2ML IJ SOLN: 4.0000 mg | Freq: Four times a day (QID) | INTRAMUSCULAR | Status: DC | PRN

## 2022-12-26 MED ORDER — IBUPROFEN 600 MG PO TABS
600.0000 mg | ORAL_TABLET | Freq: Four times a day (QID) | ORAL | Status: DC
  Administered 2022-12-26 – 2022-12-27 (×4): 600 mg via ORAL
  Filled 2022-12-26 (×3): qty 1

## 2022-12-26 MED ORDER — SODIUM CHLORIDE 0.9% FLUSH: 3.0000 mL | INTRAVENOUS | Status: DC | PRN

## 2022-12-26 MED ORDER — SENNOSIDES-DOCUSATE SODIUM 8.6-50 MG PO TABS
2.0000 | ORAL_TABLET | Freq: Every day | ORAL | Status: DC
  Administered 2022-12-27: 2 via ORAL
  Filled 2022-12-26: qty 2

## 2022-12-26 MED ORDER — ONDANSETRON HCL 4 MG PO TABS: 4.0000 mg | ORAL_TABLET | ORAL | Status: DC | PRN

## 2022-12-26 MED ORDER — SIMETHICONE 80 MG PO CHEW: 80.0000 mg | CHEWABLE_TABLET | ORAL | Status: DC | PRN

## 2022-12-26 MED ORDER — OXYTOCIN 10 UNIT/ML IJ SOLN
INTRAMUSCULAR | Status: AC
  Filled 2022-12-26: qty 2

## 2022-12-26 MED ORDER — SODIUM CHLORIDE 0.9% FLUSH: 3.0000 mL | Freq: Two times a day (BID) | INTRAVENOUS | Status: DC

## 2022-12-26 MED ORDER — FENTANYL CITRATE (PF) 100 MCG/2ML IJ SOLN: 50.0000 ug | INTRAMUSCULAR | Status: DC | PRN

## 2022-12-26 MED ORDER — SODIUM CHLORIDE 0.9 % IV SOLN: 250.0000 mL | INTRAVENOUS | Status: DC | PRN

## 2022-12-26 MED ORDER — DIPHENHYDRAMINE HCL 25 MG PO CAPS: 25.0000 mg | ORAL_CAPSULE | Freq: Four times a day (QID) | ORAL | Status: DC | PRN

## 2022-12-26 MED ORDER — PRENATAL MULTIVITAMIN CH
1.0000 | ORAL_TABLET | Freq: Every day | ORAL | Status: DC
  Administered 2022-12-27: 1 via ORAL
  Filled 2022-12-26: qty 1

## 2022-12-26 MED ORDER — IBUPROFEN 600 MG PO TABS
ORAL_TABLET | ORAL | Status: AC
  Filled 2022-12-26: qty 1

## 2022-12-26 MED ORDER — LACTATED RINGERS IV SOLN: 500.0000 mL | INTRAVENOUS | Status: DC | PRN

## 2022-12-26 MED ORDER — OXYTOCIN BOLUS FROM INFUSION: 333.0000 mL | Freq: Once | INTRAVENOUS | Status: DC

## 2022-12-26 MED ORDER — MISOPROSTOL 200 MCG PO TABS
ORAL_TABLET | ORAL | Status: AC
  Filled 2022-12-26: qty 4

## 2022-12-26 MED ORDER — OXYTOCIN-SODIUM CHLORIDE 30-0.9 UT/500ML-% IV SOLN
2.5000 [IU]/h | INTRAVENOUS | Status: DC
  Filled 2022-12-26: qty 500

## 2022-12-26 MED ORDER — ACETAMINOPHEN 325 MG PO TABS
650.0000 mg | ORAL_TABLET | ORAL | Status: DC | PRN
  Administered 2022-12-26: 650 mg via ORAL
  Filled 2022-12-26: qty 2

## 2022-12-26 MED ORDER — COCONUT OIL OIL: 1.0000 | TOPICAL_OIL | Status: DC | PRN

## 2022-12-26 MED ORDER — ZOLPIDEM TARTRATE 5 MG PO TABS: 5.0000 mg | ORAL_TABLET | Freq: Every evening | ORAL | Status: DC | PRN

## 2022-12-26 MED ORDER — ACETAMINOPHEN 500 MG PO TABS: 1000.0000 mg | ORAL_TABLET | Freq: Four times a day (QID) | ORAL | Status: DC | PRN

## 2022-12-26 MED ORDER — BENZOCAINE-MENTHOL 20-0.5 % EX AERO
1.0000 | INHALATION_SPRAY | CUTANEOUS | Status: DC | PRN
  Administered 2022-12-26: 1 via TOPICAL
  Filled 2022-12-26 (×2): qty 56

## 2022-12-26 NOTE — Progress Notes (Signed)
Labor Progress Note  Alyssa Lucas is a 22 y.o. G1P0 at 16w5dby ultrasound admitted for active labor  Subjective: she is breathing through contractions, states they are stronger and more uncomfortable  Objective: BP 129/79   Pulse 82   Temp 98.2 F (36.8 C) (Oral)   Resp (!) 22   Ht '4\' 11"'$  (1.499 m)   Wt 44 kg   LMP  (LMP Unknown)   BMI 19.59 kg/m  Notable VS details: reviewed  Fetal Assessment: FHT:  intermittent auscultation, FHT reassuring UC:   regular SVE:    Dilation: 7cm  Effacement: 100%  Station:  0  Consistency: soft  Position: anterior  Membrane status:BBOW Amniotic color: n/a  Labs: Lab Results  Component Value Date   WBC 8.4 12/26/2022   HGB 11.7 (L) 12/26/2022   HCT 35.8 (L) 12/26/2022   MCV 85.2 12/26/2022   PLT 237 12/26/2022    Assessment / Plan: Spontaneous labor, progressing normally  Labor:  Progressing normally. BBOW, discussed augmenting with AROM. After considering her options she declines AROM at this time.  Preeclampsia:  labs stable Fetal Wellbeing:   intermittent fetal monitoring Pain Control:  Labor support without medications I/D:   GBS negative Anticipated MOD:  NSVD  DGertie Fey CNM 12/26/2022, 2:34 PM

## 2022-12-26 NOTE — H&P (Signed)
OB History & Physical   History of Present Illness:   Chief Complaint: leakage of fluid and contractions   HPI:  Alyssa Lucas is a 22 y.o. G1P0 female at [redacted]w[redacted]d No LMP recorded (lmp unknown). Patient is pregnant., not consistent with [place]oreaat 124w3dwith Estimated Date of Delivery: 12/28/22.  She presents to L&D for contractions that have gotten progressively worse and leakage of small amounts of amniotic fluid.  Denies continued LOF since.   Reports active fetal movement  Contractions: every 2 to 6 minutes LOF/SROM: Possible leakage of fluid around 0300 - no continued leaking  Vaginal bleeding: bloody show   Factors complicating pregnancy:  Unremarkable - prenatal records not available  Maternal iron deficiency anemia  Chlamydia infection in pregnancy - positive 2/21, treated, needs TOC   Patient Active Problem List   Diagnosis Date Noted   Normal labor 12/26/2022   Uterine contractions 12/25/2022   Decreased fetal movement 12/07/2022   Headache in pregnancy, antepartum, third trimester 11/20/2022    Prenatal Transfer Tool  Maternal Diabetes: No Genetic Screening: Unknown Maternal Ultrasounds/Referrals: Normal Fetal Ultrasounds or other Referrals:  None Maternal Substance Abuse:  No Significant Maternal Medications:  None Significant Maternal Lab Results: Group B Strep negative and Other: Chlamydia positive 12/09/2022 - treated, needs TOC  Maternal Medical History:  History reviewed. No pertinent past medical history.  History reviewed. No pertinent surgical history.  No Known Allergies  Prior to Admission medications   Medication Sig Start Date End Date Taking? Authorizing Provider  acetaminophen (TYLENOL) 325 MG tablet Take 2 tablets (650 mg total) by mouth every 6 (six) hours as needed for fever, mild pain or headache (for pain scale < 4  OR  temperature  >/=  100.5 F). 11/20/22   McVey, ReMurray HodgkinsCNM     [facility]renatal [facility]  [facility]  Gravida Para  Term Preterm AB Living  1 0 0 0 0 0  SAB IAB Ectopic Multiple Live Births  0 0 0 0 0    # Outcome Date GA Lbr Len/2nd Weight Sex Delivery Anes PTL Lv  1 Current              Social History: She  reports that she has never smoked. She has never used smokeless tobacco. She reports that she does not drink alcohol and does not use drugs.  Family History: family history is not on file.   Review of Systems: A full review of systems was performed and negative except as noted in the HPI.     Physical Exam:  Vital Signs: BP 119/79 (BP Location: Left Arm)   Pulse 66   Temp 99.9 F (37.7 C) (Oral)   Resp (!) 24   Ht '4\' 11"'$  (1.499 m)   Wt 44 kg   LMP  (LMP Unknown)   BMI 19.59 kg/m   General: no acute distress.  HEENT: normocephalic, atraumatic Heart: regular rate & rhythm Lungs: normal respiratory effort Abdomen: soft, gravid, non-tender;  EFW: 7 lbs  Pelvic:   External: Normal external female genitalia  Cervix: Dilation: 4 / Effacement (%): 70 / Station: 0    Extremities: non-tender, symmetric, No edema bilaterally.  DTRs: 2+/2+  Neurologic: Alert & oriented x 3.    Results for orders placed or performed during the hospital encounter of 12/26/22 (from the past 24 hour(s))  CBC     Status: Abnormal   Collection Time: 12/26/22  5:35 AM  Result Value Ref Range  WBC 8.4 4.0 - 10.5 K/uL   RBC 4.20 3.87 - 5.11 MIL/uL   Hemoglobin 11.7 (L) 12.0 - 15.0 g/dL   HCT 35.8 (L) 36.0 - 46.0 %   MCV 85.2 80.0 - 100.0 fL   MCH 27.9 26.0 - 34.0 pg   MCHC 32.7 30.0 - 36.0 g/dL   RDW 21.2 (H) 11.5 - 15.5 %   Platelets 237 150 - 400 K/uL   nRBC 0.0 0.0 - 0.2 %  Type and screen [place]     Status: None   Collection Time: 12/26/22  5:35 AM  Result Value Ref Range   ABO/RH(D) A POS    Antibody Screen NEG    Sample Expiration      12/29/2022,2359 Performed at [facility], [address], [place], [place] [address]     Pertinent Results:  Prenatal  Labs: Blood type/Rh A POS   Antibody screen Negative    Rubella Immune (09/12 0000)   Varicella Immune  RPR Nonreactive (12/05 0000)   HBsAg Negative (09/12 0000)  Hep C NR   HIV Non-reactive (12/05 0000)   GC neg  Chlamydia neg  Genetic screening Unknown   1 hour GTT 102  3 hour GTT N/A  GBS Negative/-- (02/19 0000)    FHT:  FHR: 145 bpm, variability: moderate,  accelerations:  Present,  decelerations:  Absent Category/reactivity:  Category I UC:   regular, every 2-5 minutes   Cephalic by Leop[place] and SVE   US OB Limited  Result Date: 12/07/2022 CLINICAL DATA:  Decreased fetal movements EXAM: OBSTETRICAL ULTRASOUND >14 WKS AND BIOPHYSICAL PROFILE FINDINGS: Number of Fetuses: 1 Heart Rate:  140 bpm Movement: Yes Presentation: Cephalic Previa: No Placental Location: Anterior Amniotic Fluid (Subjective): Normal Amniotic Fluid (Objective): Vertical pocket 5.5cm AFI 17.0 cm FETAL BIOMETRY BPD:  8.8cm 35w 3d Maternal Findings: Cervix:  Not visualized IMPRESSION: Single live intrauterine gestation in cephalic presentation with approximate gestational age of [redacted] weeks and 3 days Electronically Signed   By: Keane Police D.O.   On: 12/07/2022 22:28    Assessment:  Alyssa Lucas is a 22 y.o. G1P0 female at 10w5dwith normal labor.   Plan:  1. Admit to Labor & Delivery - consents reviewed and obtained - Dr. JGlennon Macnotified of admission and plan of care   2. Fetal Well being  - Fetal Tracing: cat 1 - Group B Streptococcus ppx not indicated: GBS negative - Presentation: cephalic confirmed by SVE   3. Routine OB: - Prenatal labs reviewed, as above - Rh positive - CBC, T&S, RPR on admit -  Labor diet , saline lock  4. Monitoring of labor  - Contractions monitored with external toco - Pelvis adequate for trial of labor  - Plan for expectant management  - Augmentation with oxytocin and AROM as appropriate  - Plan for  intermittent fetal monitoring  - Maternal pain control as desired;  planning low intervention birth options  - Anticipate vaginal delivery  5. Post Partum Planning: - Infant feeding: expressed breast milk - Contraception:  TBD - Flu vaccine: Given prenatally - Tdap vaccine: Given prenatally - RSV vaccine:  Not indicated  AMinda Meo CMallory Shirk03/09/24 6:57 AM  ADrinda Butts CNM Certified Nurse Midwife [facility]

## 2022-12-26 NOTE — Discharge Summary (Signed)
Obstetrical Discharge Summary  Patient Name: Alyssa Lucas DOB: 12-26-2000 MRN: [id number]  Date of Admission: 12/26/2022 Date of Delivery: 12/26/22 Delivered by: Lucrezia Europe, CNM Date of Discharge: 12/26/2022  Primary OB: Princella Ion  LMP:No LMP recorded (lmp unknown). Patient is pregnant. EDC Estimated Date of Delivery: 12/28/22 Gestational Age at Delivery: [redacted]w[redacted]d  Antepartum complications:  Unremarkable - prenatal records not available  Maternal iron deficiency anemia  Chlamydia infection in pregnancy - positive 2/21, treated, needs TOC  Admitting Diagnosis: active labor  Secondary Diagnosis: NSVD Patient Active Problem List   Diagnosis Date Noted   Normal labor 12/26/2022    Augmentation: N/A Complications: None Intrapartum complications/course: She arrived with uterine contractions and progressed without any augmentation. SROM with clear fluid. She progressed to 10/100/+2 and pushed 265m on the toilet, delivering viable female infant over 1st degree laceration.  Date of Delivery: 12/26/22 Delivered By: DaLucrezia EuropeCNM Delivery Type: spontaneous vaginal delivery Anesthesia: epidural Placenta: spontaneous Laceration: 1st degree laceration Episiotomy: none Newborn Data: Live born female "Aksel JoFara OldenBirth Weight:  *** APGAR: 8, 9  Newborn Delivery   Birth date/time: 12/26/2022 13:50:00 Delivery type: Vaginal, Spontaneous     Postpartum Procedures: *** [place]:      No data to display           Post partum course: *** Patient had an uncomplicated postpartum course.  By time of discharge on PPD#***, her pain was controlled on oral pain medications; she had appropriate lochia and was ambulating, voiding without difficulty and tolerating regular diet.  She was deemed stable for discharge to home.    Discharge Physical Exam: *** BP 129/79   Pulse 82   Temp 98.2 F (36.8 C) (Oral)   Resp (!) 22   Ht '4\' 11"'$  (1.499 m)   Wt 44 kg   LMP  (LMP Unknown)    BMI 19.59 kg/m   General: NAD CV: RRR Pulm: CTABL, nl effort ABD: s/nd/nt, fundus firm and below the umbilicus Lochia: moderate ***Perineum: well approximated/intact DVT Evaluation: LE non-ttp, no evidence of DVT on exam.  Hemoglobin  Date Value Ref Range Status  12/26/2022 11.7 (L) 12.0 - 15.0 g/dL Final   HGB  Date Value Ref Range Status  09/22/2012 14.1 11.5 - 15.5 g/dL Final   HCT  Date Value Ref Range Status  12/26/2022 35.8 (L) 36.0 - 46.0 % Final  09/22/2012 41.2 35.0 - 45.0 % Final     Disposition: stable, discharge to home. Baby Feeding: breastmilk Baby Disposition: home with mom  Rh Immune globulin given: n/a, A pos Rubella vaccine given: immune Varicella vaccine given: immune Tdap vaccine given in AP or PP setting: given prenatally Flu vaccine given in AP or PP setting: given prenatally   Contraception: TBD  Prenatal Labs:  Blood type/Rh A POS   Antibody screen Negative    Rubella Immune (09/12 0000)   Varicella Immune  RPR Nonreactive (12/05 0000)   HBsAg Negative (09/12 0000)  Hep C NR   HIV Non-reactive (12/05 0000)   GC neg  Chlamydia neg  Genetic screening Unknown   1 hour GTT 102  3 hour GTT N/A  GBS Negative/-- (02/19 0000)      Plan:  [name] was discharged to home in good condition. Follow-up appointment with delivering provider in 6 weeks.  Discharge Medications: Allergies as of 12/26/2022   No Known Allergies   [facility]  Signed:  Gertie Fey, CNM 12/26/2022 2:44 PM  *** Hit refresh and delete this line

## 2022-12-27 LAB — CBC
HCT: 32.2 % — ABNORMAL LOW (ref 36.0–46.0)
Hemoglobin: 10.6 g/dL — ABNORMAL LOW (ref 12.0–15.0)
MCH: 28.4 pg (ref 26.0–34.0)
MCHC: 32.9 g/dL (ref 30.0–36.0)
MCV: 86.3 fL (ref 80.0–100.0)
Platelets: 230 10*3/uL (ref 150–400)
RBC: 3.73 MIL/uL — ABNORMAL LOW (ref 3.87–5.11)
RDW: 21.2 % — ABNORMAL HIGH (ref 11.5–15.5)
WBC: 9.9 10*3/uL (ref 4.0–10.5)
nRBC: 0 % (ref 0.0–0.2)

## 2022-12-27 LAB — CHLAMYDIA/NGC RT PCR (ARMC ONLY)
Chlamydia Tr: NOT DETECTED
N gonorrhoeae: NOT DETECTED

## 2022-12-27 MED ORDER — SIMETHICONE 80 MG PO CHEW: 80.0000 mg | CHEWABLE_TABLET | Freq: Four times a day (QID) | ORAL | 0 refills | Status: AC | PRN

## 2022-12-27 MED ORDER — WITCH HAZEL-GLYCERIN EX PADS: 1.0000 | MEDICATED_PAD | CUTANEOUS | 0 refills | Status: AC | PRN

## 2022-12-27 MED ORDER — DIBUCAINE (PERIANAL) 1 % EX OINT: 1.0000 | TOPICAL_OINTMENT | CUTANEOUS | 1 refills | Status: AC | PRN

## 2022-12-27 MED ORDER — SENNOSIDES-DOCUSATE SODIUM 8.6-50 MG PO TABS: 2.0000 | ORAL_TABLET | Freq: Every evening | ORAL | 0 refills | Status: AC | PRN

## 2022-12-27 MED ORDER — IBUPROFEN 600 MG PO TABS: 600.0000 mg | ORAL_TABLET | Freq: Four times a day (QID) | ORAL | 0 refills | Status: AC | PRN

## 2022-12-27 MED ORDER — PRENATAL PLUS VITAMIN/MINERAL 27-1 MG PO TABS: 1.0000 | ORAL_TABLET | Freq: Every day | ORAL | 11 refills | Status: AC

## 2022-12-27 MED ORDER — BENZOCAINE-MENTHOL 20-0.5 % EX AERO: 1.0000 | INHALATION_SPRAY | CUTANEOUS | 2 refills | Status: AC | PRN

## 2022-12-27 NOTE — TOC Initial Note (Signed)
Transition of Care [facility]) - Initial/Assessment Note    Patient Details  Name: Alyssa Lucas MRN: [id number] Date of Birth: 2001/06/19  Transition [facility]) CM/SW Contact:    Loreta [address], [place] Phone Number: 12/27/2022, 2:05 PM  Clinical Narrative:                  CSW spoke with pt via phone regarding consult. Pt states she has everything she needs and declines any additional resources. Pt states she will be staying with her mother and that her mother is a support for her. Pt states she has chosen Dr. Princella Ion as the baby's pediatrician. No barriers to transportation.        Patient Goals and CMS Choice            Expected Discharge Plan and Services         Expected Discharge Date: 12/27/22                                    Prior Living Arrangements/Services                       Activities of Daily Living Home Assistive Devices/Equipment: None ADL Screening (condition at time of admission) Patient's cognitive ability adequate to safely complete daily activities?: Yes Is the patient deaf or have difficulty hearing?: No Does the patient have difficulty seeing, even when wearing glasses/contacts?: No Does the patient have difficulty concentrating, remembering, or making decisions?: No Patient able to express need for assistance with ADLs?: Yes Does the patient have difficulty dressing or bathing?: No Independently performs ADLs?: Yes (appropriate for developmental age) Does the patient have difficulty walking or climbing stairs?: No Weakness of Legs: None Weakness of Arms/Hands: None  Permission Sought/Granted                  Emotional Assessment              Admission diagnosis:  Normal labor [O80, Z37.9] Patient Active Problem List   Diagnosis Date Noted   NSVD (normal spontaneous vaginal delivery) 12/26/2022   PCP:  Center, [place]:   [place] [address] [place]) [place] [address] Phone: [phone] Fax: [phone]     Social Determinants of Health (SDOH) Social History: SDOH Screenings   Food Insecurity: No Food Insecurity (12/26/2022)  Housing: Low Risk  (12/26/2022)  Transportation Needs: No Transportation Needs (12/26/2022)  Utilities: Not At Risk (12/26/2022)  Tobacco Use: Low Risk  (12/26/2022)   SDOH Interventions:     Readmission Risk Interventions     No data to display

## 2022-12-27 NOTE — Progress Notes (Signed)
Loetta Rough CNM notified that Lab called and stated the Urine for Chlamydia could not result due to bleed present in specimen. I also informed Redmond Pulling, CNM that Lab stated a cervical swab could result in the presence of PP bleeding.  Loetta Rough CNM stated she "will address this in the morning."

## 2022-12-27 NOTE — Discharge Instructions (Addendum)
Please keep your scheduled appointment.  If you have any questions or concerns please call your on call  provider.  If you have urgent concerns please go to the nearest emergency department for evaluation.

## 2022-12-27 NOTE — Progress Notes (Signed)
D/C home to car via w/c   verb understanding of d/c instructions

## 2023-01-27 ENCOUNTER — Telehealth: Payer: Self-pay

## 2023-01-27 NOTE — Telephone Encounter (Signed)
[facility]- Discharge Call Back-Left pt a voice mail about the following below.  1-Do you have any questions or concerns about yourself as you heal? 2-Any concerns or questions about your baby? 3-How was your stay at the hospital? 5-How did our team work together to care for you? You should be receiving a survey in the mail soon.   We would really appreciate it if you could fill that out for [place] and return it in the mail.  We value the feedback to make improvements and continue the great work we do.   If you have any questions please feel free to call me back at [phone]
